# Patient Record
Sex: Female | Born: 1965
Health system: Southern US, Community
[De-identification: ages and names within clinical notes are randomized; demographics above are authoritative.]

## PROBLEM LIST (undated history)

## (undated) DIAGNOSIS — R7301 Impaired fasting glucose: Secondary | ICD-10-CM

## (undated) DIAGNOSIS — Z9889 Other specified postprocedural states: Secondary | ICD-10-CM

## (undated) DIAGNOSIS — F338 Other recurrent depressive disorders: Secondary | ICD-10-CM

## (undated) DIAGNOSIS — R112 Nausea with vomiting, unspecified: Secondary | ICD-10-CM

## (undated) DIAGNOSIS — J302 Other seasonal allergic rhinitis: Secondary | ICD-10-CM

## (undated) DIAGNOSIS — T7840XA Allergy, unspecified, initial encounter: Secondary | ICD-10-CM

## (undated) HISTORY — DX: Allergy, unspecified, initial encounter: T78.40XA

## (undated) HISTORY — PX: EYE SURGERY: SHX253

## (undated) HISTORY — PX: DILATION AND CURETTAGE OF UTERUS: SHX78

## (undated) HISTORY — DX: Other recurrent depressive disorders: F33.8

## (undated) HISTORY — PX: LIPOSUCTION: SHX10

## (undated) HISTORY — DX: Impaired fasting glucose: R73.01

---

## 2004-06-13 ENCOUNTER — Ambulatory Visit (HOSPITAL_COMMUNITY): Admission: RE | Admit: 2004-06-13 | Discharge: 2004-06-13 | Payer: Self-pay | Admitting: Family Medicine

## 2006-03-03 ENCOUNTER — Ambulatory Visit (HOSPITAL_COMMUNITY): Admission: RE | Admit: 2006-03-03 | Discharge: 2006-03-03 | Payer: Self-pay | Admitting: Family Medicine

## 2009-06-21 ENCOUNTER — Encounter: Admission: RE | Admit: 2009-06-21 | Discharge: 2009-06-21 | Payer: Self-pay | Admitting: Family Medicine

## 2009-07-03 ENCOUNTER — Encounter: Admission: RE | Admit: 2009-07-03 | Discharge: 2009-07-03 | Payer: Self-pay | Admitting: Family Medicine

## 2010-07-11 ENCOUNTER — Encounter: Admission: RE | Admit: 2010-07-11 | Discharge: 2010-07-11 | Payer: Self-pay | Admitting: Family Medicine

## 2010-08-31 ENCOUNTER — Encounter: Payer: Self-pay | Admitting: Family Medicine

## 2011-06-10 ENCOUNTER — Other Ambulatory Visit: Payer: Self-pay | Admitting: Family Medicine

## 2011-06-10 DIAGNOSIS — Z1231 Encounter for screening mammogram for malignant neoplasm of breast: Secondary | ICD-10-CM

## 2011-07-17 ENCOUNTER — Ambulatory Visit
Admission: RE | Admit: 2011-07-17 | Discharge: 2011-07-17 | Disposition: A | Discharge status: Home / Self Care | Payer: BC Managed Care – PPO | Source: Ambulatory Visit | Attending: Family Medicine | Admitting: Family Medicine

## 2011-07-17 DIAGNOSIS — Z1231 Encounter for screening mammogram for malignant neoplasm of breast: Secondary | ICD-10-CM

## 2012-05-24 ENCOUNTER — Other Ambulatory Visit: Payer: Self-pay | Admitting: Family Medicine

## 2012-05-24 DIAGNOSIS — M549 Dorsalgia, unspecified: Secondary | ICD-10-CM

## 2012-05-26 ENCOUNTER — Ambulatory Visit (HOSPITAL_COMMUNITY): Payer: Self-pay

## 2012-06-01 ENCOUNTER — Other Ambulatory Visit: Payer: Self-pay | Admitting: Family Medicine

## 2012-06-01 DIAGNOSIS — M549 Dorsalgia, unspecified: Secondary | ICD-10-CM

## 2012-06-07 ENCOUNTER — Ambulatory Visit
Admission: RE | Admit: 2012-06-07 | Discharge: 2012-06-07 | Disposition: A | Discharge status: Home / Self Care | Payer: Managed Care, Other (non HMO) | Source: Ambulatory Visit | Attending: Family Medicine | Admitting: Family Medicine

## 2012-06-07 ENCOUNTER — Other Ambulatory Visit: Payer: Self-pay | Admitting: Family Medicine

## 2012-06-07 DIAGNOSIS — M549 Dorsalgia, unspecified: Secondary | ICD-10-CM

## 2012-06-07 MED ORDER — METHYLPREDNISOLONE ACETATE 40 MG/ML INJ SUSP (RADIOLOG
120.0000 mg | Freq: Once | INTRAMUSCULAR | Status: AC
  Administered 2012-06-07: 120 mg via EPIDURAL

## 2012-06-07 MED ORDER — IOHEXOL 180 MG/ML  SOLN
1.0000 mL | Freq: Once | INTRAMUSCULAR | Status: AC | PRN
  Administered 2012-06-07: 1 mL via EPIDURAL

## 2012-07-05 ENCOUNTER — Other Ambulatory Visit: Payer: Self-pay | Admitting: Family Medicine

## 2012-07-05 DIAGNOSIS — M549 Dorsalgia, unspecified: Secondary | ICD-10-CM

## 2012-07-15 ENCOUNTER — Ambulatory Visit
Admission: RE | Admit: 2012-07-15 | Discharge: 2012-07-15 | Disposition: A | Discharge status: Home / Self Care | Payer: Managed Care, Other (non HMO) | Source: Ambulatory Visit | Attending: Family Medicine | Admitting: Family Medicine

## 2012-07-15 ENCOUNTER — Ambulatory Visit
Admission: RE | Admit: 2012-07-15 | Discharge: 2012-07-15 | Disposition: A | Discharge status: Home / Self Care | Payer: Self-pay | Source: Ambulatory Visit | Attending: Family Medicine | Admitting: Family Medicine

## 2012-07-15 ENCOUNTER — Other Ambulatory Visit: Payer: Self-pay | Admitting: Family Medicine

## 2012-07-15 VITALS — BP 112/65 | HR 80

## 2012-07-15 DIAGNOSIS — M549 Dorsalgia, unspecified: Secondary | ICD-10-CM

## 2012-07-15 MED ORDER — IOHEXOL 180 MG/ML  SOLN
1.0000 mL | Freq: Once | INTRAMUSCULAR | Status: AC | PRN
  Administered 2012-07-15: 1 mL via EPIDURAL

## 2012-07-15 MED ORDER — METHYLPREDNISOLONE ACETATE 40 MG/ML INJ SUSP (RADIOLOG
120.0000 mg | Freq: Once | INTRAMUSCULAR | Status: AC
  Administered 2012-07-15: 120 mg via EPIDURAL

## 2012-07-19 ENCOUNTER — Other Ambulatory Visit: Payer: Self-pay | Admitting: Family Medicine

## 2012-07-19 DIAGNOSIS — Z1231 Encounter for screening mammogram for malignant neoplasm of breast: Secondary | ICD-10-CM

## 2012-07-22 ENCOUNTER — Other Ambulatory Visit: Payer: Managed Care, Other (non HMO)

## 2012-08-02 ENCOUNTER — Ambulatory Visit
Admission: RE | Admit: 2012-08-02 | Discharge: 2012-08-02 | Disposition: A | Discharge status: Home / Self Care | Payer: Managed Care, Other (non HMO) | Source: Ambulatory Visit | Attending: Family Medicine | Admitting: Family Medicine

## 2012-08-02 DIAGNOSIS — Z1231 Encounter for screening mammogram for malignant neoplasm of breast: Secondary | ICD-10-CM

## 2012-11-16 ENCOUNTER — Ambulatory Visit (INDEPENDENT_AMBULATORY_CARE_PROVIDER_SITE_OTHER): Payer: Managed Care, Other (non HMO) | Admitting: Family Medicine

## 2012-11-16 ENCOUNTER — Encounter: Payer: Self-pay | Admitting: Family Medicine

## 2012-11-16 VITALS — BP 112/78 | Ht 64.0 in | Wt 190.6 lb

## 2012-11-16 DIAGNOSIS — S0003XA Contusion of scalp, initial encounter: Secondary | ICD-10-CM

## 2012-11-16 DIAGNOSIS — S0083XA Contusion of other part of head, initial encounter: Secondary | ICD-10-CM

## 2012-11-16 DIAGNOSIS — S0093XA Contusion of unspecified part of head, initial encounter: Secondary | ICD-10-CM

## 2012-11-16 MED ORDER — AMOXICILLIN-POT CLAVULANATE 875-125 MG PO TABS: 1.0000 | ORAL_TABLET | Freq: Two times a day (BID) | ORAL | Status: DC

## 2012-11-16 NOTE — Patient Instructions (Signed)
If area on zygomatic arch not improving over next two days then we would do an xray, call sooner if any issues

## 2012-11-16 NOTE — Progress Notes (Signed)
  Subjective:    Patient ID: Ashley Mcintosh, female    DOB: 12/02/1965, 47 y.o.   MRN: [id number]  Head Injury  The incident occurred 12 to 24 hours ago. The injury mechanism was a direct blow. There was no loss of consciousness. There was no blood loss. The quality of the pain is described as dull and throbbing. The pain is at a severity of 3/10. The pain is mild. The pain has been fluctuating since the injury. Associated symptoms include blurred vision and headaches. She has tried acetaminophen and ice for the symptoms. The treatment provided mild relief.   She denies double vision she states she just feels fuzzy in her vision she also describes sinus pressure and pain on the right side he states is similar to what she gets when she gets a sinus infection she denies any drainage coughing the ball  Had bounced came through the open window at the concession stand striking the right side of her head at the level of the zygomatic arch. It did not hit her temple. There was no loss of consciousness. No nausea or vomiting. No prior history of any of this.   Review of Systems  Eyes: Positive for blurred vision.  Neurological: Positive for headaches.       Objective:   Physical Examvital signs noted. Pupils responsive to light optic disc sharp EOMI eardrums normal no hemorrhage seen throat normal temple areas nontender zygomatic arch on the right side tender with some bruising and swelling no deformity fell in the bone just tenderness sinus nontender neck no masses lungs clear        Assessment & Plan:  Head contusion-bruising of the zygomatic arch-should gradually get better I doubt that this is a concussion I don't find any evidence of the internal head injury I don't recommend CT scan or MRI at this point I doubt there is any internal subdural hematoma or temporal artery hematoma. I told the patient if she's not doing better in the next couple days we may need to do plain x-rays of the zygomatic  arch. She was given a prescription for antibiotics if her sinus symptoms get worse. Warning signs were discussed with the patient

## 2012-12-12 ENCOUNTER — Telehealth: Payer: Self-pay | Admitting: Family Medicine

## 2012-12-12 MED ORDER — FLUCONAZOLE 150 MG PO TABS: ORAL_TABLET | ORAL | Status: DC

## 2012-12-12 NOTE — Telephone Encounter (Signed)
Ashley Mcintosh I sent over the Diflucan can you check the dose on Qysemia and see if it is right before I print ir out for patient.

## 2012-12-12 NOTE — Telephone Encounter (Signed)
qsymia 7.5/46 #30 one po qd called into [facility] [place]. Pt notified on voicemail

## 2012-12-12 NOTE — Telephone Encounter (Signed)
One mo worth of qysimia. No ov re this since last aug--will need to see carolyn before further. Diflucan 150 one po three days apart

## 2012-12-12 NOTE — Telephone Encounter (Signed)
7.5/46 is correct dose for Qsymia; I can increase dose at office visit if needed

## 2012-12-12 NOTE — Telephone Encounter (Signed)
Patient needs RX of Fluconazole for yeast infection and also [facility]ine to [facility] [place]

## 2012-12-16 ENCOUNTER — Other Ambulatory Visit: Payer: Self-pay | Admitting: Nurse Practitioner

## 2012-12-16 ENCOUNTER — Telehealth: Payer: Self-pay | Admitting: Family Medicine

## 2012-12-16 MED ORDER — PHENTERMINE HCL 37.5 MG PO TABS: 37.5000 mg | ORAL_TABLET | Freq: Every day | ORAL | Status: DC

## 2012-12-16 NOTE — Telephone Encounter (Signed)
Sorry.  That was the last Rx on chart.  Will call in Phentermine.

## 2012-12-16 NOTE — Telephone Encounter (Signed)
Patient called last week to have [facility] [place]-the message was taken for phentermine, but the message changed to Qysmia.. Patient says that Merril Abbe is not covered under her insurance and would like her phentermine called back in.

## 2012-12-16 NOTE — Telephone Encounter (Signed)
Rx printed and faxed to [facility] [place]. Patient notified

## 2012-12-26 ENCOUNTER — Telehealth: Payer: Self-pay | Admitting: Family Medicine

## 2012-12-26 NOTE — Telephone Encounter (Signed)
Pt states she has not ever seen a back specialist or physical medicine specialist for her back. She states the sciatic nerve is bothering her. She uses Aleve that she was told to use. Pt is willing to see a specialist.

## 2012-12-26 NOTE — Telephone Encounter (Signed)
Had 2 lumbar injections in November.  Having same problems again.  Can Doctor refer for another shot or does she need to start all over again?  Does she need another MRI?  Symptoms are on the same side as before.  Please call patient.

## 2012-12-26 NOTE — Telephone Encounter (Signed)
Would not feel comfortable ordering a third injection. Has she ever been evaluated by a back specialist or physical medicine specialist for her back problem. Patient had MRI in October of 2013. Unless there is a dramatic shift in her problem I would not recommend a repeat at this point. Please discuss with the patient find out what she would like done.

## 2012-12-27 ENCOUNTER — Other Ambulatory Visit: Payer: Self-pay | Admitting: Family Medicine

## 2012-12-27 DIAGNOSIS — M5126 Other intervertebral disc displacement, lumbar region: Secondary | ICD-10-CM

## 2012-12-27 NOTE — Telephone Encounter (Signed)
i put in the order for specialist consultation. Our staff will  Arrange and call her

## 2012-12-27 NOTE — Telephone Encounter (Signed)
Left message to pt of order consult arranged

## 2013-01-11 ENCOUNTER — Other Ambulatory Visit: Payer: Self-pay | Admitting: Neurosurgery

## 2013-01-11 DIAGNOSIS — M549 Dorsalgia, unspecified: Secondary | ICD-10-CM

## 2013-01-19 ENCOUNTER — Other Ambulatory Visit: Payer: Self-pay | Admitting: Neurosurgery

## 2013-01-19 ENCOUNTER — Ambulatory Visit
Admission: RE | Admit: 2013-01-19 | Discharge: 2013-01-19 | Disposition: A | Discharge status: Home / Self Care | Payer: Managed Care, Other (non HMO) | Source: Ambulatory Visit | Attending: Neurosurgery | Admitting: Neurosurgery

## 2013-01-19 DIAGNOSIS — M549 Dorsalgia, unspecified: Secondary | ICD-10-CM

## 2013-01-19 MED ORDER — METHYLPREDNISOLONE ACETATE 40 MG/ML INJ SUSP (RADIOLOG: 120.0000 mg | Freq: Once | INTRAMUSCULAR | Status: DC

## 2013-01-19 MED ORDER — IOHEXOL 180 MG/ML  SOLN: 1.0000 mL | Freq: Once | INTRAMUSCULAR | Status: AC | PRN

## 2013-03-28 ENCOUNTER — Other Ambulatory Visit: Payer: Self-pay | Admitting: Nurse Practitioner

## 2013-04-14 ENCOUNTER — Telehealth: Payer: Self-pay | Admitting: Nurse Practitioner

## 2013-04-14 ENCOUNTER — Ambulatory Visit (INDEPENDENT_AMBULATORY_CARE_PROVIDER_SITE_OTHER): Payer: Managed Care, Other (non HMO) | Admitting: Nurse Practitioner

## 2013-04-14 ENCOUNTER — Encounter: Payer: Self-pay | Admitting: Nurse Practitioner

## 2013-04-14 VITALS — BP 116/78 | Ht 64.0 in | Wt 173.6 lb

## 2013-04-14 DIAGNOSIS — Z124 Encounter for screening for malignant neoplasm of cervix: Secondary | ICD-10-CM

## 2013-04-14 DIAGNOSIS — Z01419 Encounter for gynecological examination (general) (routine) without abnormal findings: Secondary | ICD-10-CM

## 2013-04-14 DIAGNOSIS — R7301 Impaired fasting glucose: Secondary | ICD-10-CM

## 2013-04-14 DIAGNOSIS — Z0000 Encounter for general adult medical examination without abnormal findings: Secondary | ICD-10-CM

## 2013-04-14 LAB — BASIC METABOLIC PANEL
Calcium: 9.2 mg/dL (ref 8.4–10.5)
Glucose, Bld: 87 mg/dL (ref 70–99)
Potassium: 4.3 mEq/L (ref 3.5–5.3)
Sodium: 137 mEq/L (ref 135–145)

## 2013-04-14 LAB — LIPID PANEL
Cholesterol: 188 mg/dL (ref 0–200)
HDL: 71 mg/dL (ref >39)
Total CHOL/HDL Ratio: 2.6 Ratio
Triglycerides: 60 mg/dL (ref <150)
VLDL: 12 mg/dL (ref 0–40)

## 2013-04-14 LAB — HEMOGLOBIN A1C: Mean Plasma Glucose: 108 mg/dL (ref <117)

## 2013-04-14 MED ORDER — FLUCONAZOLE 150 MG PO TABS: ORAL_TABLET | ORAL | Status: DC

## 2013-04-14 MED ORDER — PHENTERMINE HCL 37.5 MG PO TABS: 37.5000 mg | ORAL_TABLET | Freq: Every day | ORAL | Status: DC

## 2013-04-14 NOTE — Telephone Encounter (Signed)
Patient is calling to tell you that she uses [facility]

## 2013-04-15 ENCOUNTER — Encounter: Payer: Self-pay | Admitting: Nurse Practitioner

## 2013-04-16 ENCOUNTER — Encounter: Payer: Self-pay | Admitting: Nurse Practitioner

## 2013-04-16 DIAGNOSIS — F338 Other recurrent depressive disorders: Secondary | ICD-10-CM | POA: Insufficient documentation

## 2013-04-16 DIAGNOSIS — R7301 Impaired fasting glucose: Secondary | ICD-10-CM | POA: Insufficient documentation

## 2013-04-16 MED ORDER — FLUTICASONE PROPIONATE 50 MCG/ACT NA SUSP: NASAL | Status: DC

## 2013-04-16 MED ORDER — BUPROPION HCL ER (XL) 150 MG PO TB24: 150.0000 mg | ORAL_TABLET | Freq: Every day | ORAL | Status: DC

## 2013-04-16 MED ORDER — NAPROXEN 500 MG PO TABS: 500.0000 mg | ORAL_TABLET | Freq: Two times a day (BID) | ORAL | Status: DC

## 2013-04-16 NOTE — Progress Notes (Signed)
Subjective:    Patient ID: Ashley Mcintosh, female    DOB: 10-Dec-1965, 47 y.o.   MRN: [id number]  HPI presents for a wellness checkup. Gets regular dental exams. Regular eye exams. Married, same sexual partner. No vaginal discharge or pelvic pain. Has Mirena for birth control. Staying active but limited due to persistent sciatica, had an injection in her back in June which helps some. Some difficulty sleeping. No caffeine intake. Would like to continue phentermine for 2 more months. Patient takes Wellbutrin during the wintertime for seasonal effective disorder. Requesting a refill on this. Plans to see a chiropractor for back pain. Takes occasional HCTZ no more than 2-3 times per week for edema.    Review of Systems  Constitutional: Positive for activity change and fatigue. Negative for fever and appetite change.  HENT: Positive for congestion and rhinorrhea. Negative for hearing loss, ear pain, sore throat, dental problem and sinus pressure.   Eyes: Negative for visual disturbance.  Respiratory: Negative for cough, chest tightness, shortness of breath and wheezing.   Cardiovascular: Negative for chest pain and leg swelling.  Gastrointestinal: Negative for nausea, vomiting, abdominal pain, diarrhea, constipation and blood in stool.  Genitourinary: Negative for dysuria, urgency, frequency, vaginal discharge, difficulty urinating, menstrual problem and pelvic pain.  Neurological: Negative for headaches.  Psychiatric/Behavioral: Positive for sleep disturbance. Negative for dysphoric mood. The patient is not nervous/anxious.        Objective:   Physical Exam  Vitals reviewed. Constitutional: She is oriented to person, place, and time. She appears well-developed. No distress.  HENT:  Right Ear: External ear normal.  Left Ear: External ear normal.  Mouth/Throat: Oropharynx is clear and moist.  Neck: Normal range of motion. Neck supple. No tracheal deviation present. No thyromegaly present.   Cardiovascular: Normal rate, regular rhythm and normal heart sounds.  Exam reveals no gallop.   No murmur heard. Pulmonary/Chest: Effort normal and breath sounds normal.  Abdominal: Soft. She exhibits no distension. There is no tenderness.  Genitourinary: Vagina normal and uterus normal. No vaginal discharge found.  Musculoskeletal: She exhibits no edema.  Lymphadenopathy:    She has no cervical adenopathy.  Neurological: She is alert and oriented to person, place, and time.  Skin: Skin is warm and dry. No rash noted.  Psychiatric: She has a normal mood and affect. Her behavior is normal.  Breast exam: No masses noted. Dense tissue. Axilla no adenopathy. External GU normal. Vagina no discharge. Cervix normal in appearance. No CMT. IUD string noted. Bimanual exam normal, no masses noted.       Assessment & Plan:  Well woman exam  Screening for cervical cancer - Plan: Pap IG w/ reflex to HPV when ASC-U  Routine general medical examination at a health care facility - Plan: Basic metabolic panel, Lipid panel  Impaired fasting glucose - Plan: Hemoglobin A1c  Meds ordered this encounter  Medications  . phentermine (ADIPEX-P) 37.5 MG tablet    Sig: Take 1 tablet (37.5 mg total) by mouth daily before breakfast.    Dispense:  30 tablet    Refill:  1    Order Specific Question:  Supervising Provider    Answer:  Merlyn Albert [[id number]]  . fluconazole (DIFLUCAN) 150 MG tablet    Sig: One tab 3 days apart    Dispense:  2 tablet    Refill:  0    Order Specific Question:  Supervising Provider    Answer:  Merlyn Albert [[id number]]  . buPROPion [facility]  XL) 150 MG 24 hr tablet    Sig: Take 1 tablet (150 mg total) by mouth daily.    Dispense:  90 tablet    Refill:  1    Order Specific Question:  Supervising Provider    Answer:  Merlyn Albert [[id number]]  . fluticasone (FLONASE) 50 MCG/ACT nasal spray    Sig: 2 sprays each nostril once daily prn allergies    Dispense:  48 g     Refill:  3    Order Specific Question:  Supervising Provider    Answer:  Merlyn Albert [[id number]]  . naproxen (NAPROSYN) 500 MG tablet    Sig: Take 1 tablet (500 mg total) by mouth 2 (two) times daily with a meal. Prn pain    Dispense:  90 tablet    Refill:  1    Order Specific Question:  Supervising Provider    Answer:  Merlyn Albert [[id number]]   Do not take Wellbutrin until phentermine is complete. Recommend melatonin 5-10 mg each bedtime for sleep. Call back if no improvement. Encouraged vitamin D and calcium in her diet. Next physical in one year.

## 2013-04-16 NOTE — Assessment & Plan Note (Signed)
FBS and hemoglobin A1c ordered.

## 2013-04-17 ENCOUNTER — Other Ambulatory Visit: Payer: Self-pay

## 2013-04-17 LAB — PAP IG W/ RFLX HPV ASCU

## 2013-04-17 MED ORDER — BUPROPION HCL ER (XL) 150 MG PO TB24: 150.0000 mg | ORAL_TABLET | Freq: Every day | ORAL | Status: DC

## 2013-04-17 MED ORDER — FLUTICASONE PROPIONATE 50 MCG/ACT NA SUSP: NASAL | Status: DC

## 2013-04-17 MED ORDER — NAPROXEN 500 MG PO TABS: 500.0000 mg | ORAL_TABLET | Freq: Two times a day (BID) | ORAL | Status: DC

## 2013-04-28 ENCOUNTER — Encounter: Payer: Self-pay | Admitting: Nurse Practitioner

## 2013-04-28 ENCOUNTER — Other Ambulatory Visit: Payer: Self-pay | Admitting: Nurse Practitioner

## 2013-04-28 MED ORDER — HYDROCHLOROTHIAZIDE 25 MG PO TABS: 25.0000 mg | ORAL_TABLET | Freq: Every day | ORAL | Status: DC

## 2013-05-11 ENCOUNTER — Other Ambulatory Visit: Payer: Self-pay | Admitting: Nurse Practitioner

## 2013-05-11 ENCOUNTER — Encounter: Payer: Self-pay | Admitting: Nurse Practitioner

## 2013-05-11 DIAGNOSIS — M549 Dorsalgia, unspecified: Secondary | ICD-10-CM

## 2013-06-01 ENCOUNTER — Encounter: Payer: Self-pay | Admitting: Nurse Practitioner

## 2013-06-02 ENCOUNTER — Other Ambulatory Visit: Payer: Self-pay | Admitting: Nurse Practitioner

## 2013-06-02 DIAGNOSIS — M549 Dorsalgia, unspecified: Secondary | ICD-10-CM

## 2013-06-16 ENCOUNTER — Ambulatory Visit
Admission: RE | Admit: 2013-06-16 | Discharge: 2013-06-16 | Disposition: A | Discharge status: Home / Self Care | Payer: Managed Care, Other (non HMO) | Source: Ambulatory Visit | Attending: Nurse Practitioner | Admitting: Nurse Practitioner

## 2013-06-16 ENCOUNTER — Other Ambulatory Visit: Payer: Self-pay | Admitting: Nurse Practitioner

## 2013-06-16 VITALS — BP 115/66 | HR 82

## 2013-06-16 DIAGNOSIS — M549 Dorsalgia, unspecified: Secondary | ICD-10-CM

## 2013-06-16 MED ORDER — METHYLPREDNISOLONE ACETATE 40 MG/ML INJ SUSP (RADIOLOG
120.0000 mg | Freq: Once | INTRAMUSCULAR | Status: AC
  Administered 2013-06-16: 120 mg via EPIDURAL

## 2013-06-16 MED ORDER — IOHEXOL 180 MG/ML  SOLN
1.0000 mL | Freq: Once | INTRAMUSCULAR | Status: AC | PRN
  Administered 2013-06-16: 1 mL via EPIDURAL

## 2013-07-12 ENCOUNTER — Encounter: Payer: Self-pay | Admitting: Nurse Practitioner

## 2013-07-12 ENCOUNTER — Ambulatory Visit (INDEPENDENT_AMBULATORY_CARE_PROVIDER_SITE_OTHER): Payer: Managed Care, Other (non HMO) | Admitting: Nurse Practitioner

## 2013-07-12 VITALS — BP 112/84 | Ht 64.0 in | Wt 182.2 lb

## 2013-07-12 DIAGNOSIS — M545 Low back pain, unspecified: Secondary | ICD-10-CM

## 2013-07-12 DIAGNOSIS — IMO0002 Reserved for concepts with insufficient information to code with codable children: Secondary | ICD-10-CM

## 2013-07-12 DIAGNOSIS — M792 Neuralgia and neuritis, unspecified: Secondary | ICD-10-CM

## 2013-07-12 MED ORDER — HYDROCODONE-ACETAMINOPHEN 5-325 MG PO TABS: 1.0000 | ORAL_TABLET | ORAL | Status: DC | PRN

## 2013-07-12 MED ORDER — PREDNISONE 20 MG PO TABS: ORAL_TABLET | ORAL | Status: DC

## 2013-07-12 NOTE — Progress Notes (Signed)
Subjective:  Presents for complaints of generalized pain along the lumbar area for the past 2 weeks. Patient gets regular injections in the left back area for sciatica. Her last ones were on 06/16/2013. This pain feels different. Patient has modified her work area where she can do sitting or standing. Cannot tolerate prolonged sitting or standing. Last night began having severe pain in the lateral upper thigh area localized. Pain was severe enough to keep her from sleeping. Has developed some weakness in the left leg especially with prolonged sitting or standing. No change in bowel or bladder habits. Has tried a TENS unit and anti-inflammatory with no relief. Has also tried visits to the chiropractor in the past with no relief.  Objective:   BP 112/84  Ht 5\' 4"  (1.626 m)  Wt 182 lb 4 oz (82.668 kg)  BMI 31.27 kg/m2 NAD. Alert, oriented. Lungs clear. Heart regular rate rhythm. Mild generalized lumbar paraspinal tenderness to palpation. No tenderness with palpation of the left hip joint. Good ROM of the left hip without any tenderness. SLR normal on the right, very limited on the left, produces severe pain in the low back area left side. Patellar and Achilles reflexes normal. Gait very slow but steady. Can perform heel and toe walking. Last MRI was October 2013.  Assessment:Low back pain - Plan: MR Lumbar Spine Wo Contrast  Neuropathic pain  Plan:  Meds ordered this encounter  Medications  . fluconazole (DIFLUCAN) 150 MG tablet    Sig: as needed. One tab 3 days apart  . predniSONE (DELTASONE) 20 MG tablet    Sig: 3 po qd x 3 d then 2 po qd x 3 d then 1 po qd x 3 d    Dispense:  18 tablet    Refill:  0    Order Specific Question:  Supervising Provider    Answer:  Merlyn Albert [[id number]]  . HYDROcodone-acetaminophen (NORCO/VICODIN) 5-325 MG per tablet    Sig: Take 1 tablet by mouth every 4 (four) hours as needed for moderate pain.    Dispense:  30 tablet    Refill:  0    Order Specific  Question:  Supervising Provider    Answer:  Riccardo Dubin   Ice/heat applications. Activities are work as tolerated. Further followup based on MRI report. Warning signs reviewed. Patient to call back sooner if any problems.

## 2013-07-12 NOTE — Assessment & Plan Note (Signed)
.   predniSONE (DELTASONE) 20 MG tablet    Sig: 3 po qd x 3 d then 2 po qd x 3 d then 1 po qd x 3 d    Dispense:  18 tablet    Refill:  0    Order Specific Question:  Supervising Provider    Answer:  Merlyn Albert [[id number]]  . HYDROcodone-acetaminophen (NORCO/VICODIN) 5-325 MG per tablet    Sig: Take 1 tablet by mouth every 4 (four) hours as needed for moderate pain.    Dispense:  30 tablet    Refill:  0    Order Specific Question:  Supervising Provider    Answer:  Riccardo Dubin   Ice/heat applications. Activities are work as tolerated. Further followup based on MRI report. Warning signs reviewed. Patient to call back sooner if any problems.

## 2013-07-12 NOTE — Assessment & Plan Note (Signed)
.   predniSONE (DELTASONE) 20 MG tablet    Sig: 3 po qd x 3 d then 2 po qd x 3 d then 1 po qd x 3 d    Dispense:  18 tablet    Refill:  0    Order Specific Question:  Supervising Provider    Answer:  LUKING, WILLIAM S [[id number]]  . HYDROcodone-acetaminophen (NORCO/VICODIN) 5-325 MG per tablet    Sig: Take 1 tablet by mouth every 4 (four) hours as needed for moderate pain.    Dispense:  30 tablet    Refill:  0    Order Specific Question:  Supervising Provider    Answer:  LUKING, WILLIAM S [[id number]]   Ice/heat applications. Activities are work as tolerated. Further followup based on MRI report. Warning signs reviewed. Patient to call back sooner if any problems. 

## 2013-07-13 ENCOUNTER — Other Ambulatory Visit: Payer: Self-pay | Admitting: *Deleted

## 2013-07-14 ENCOUNTER — Other Ambulatory Visit: Payer: Self-pay | Admitting: *Deleted

## 2013-07-14 ENCOUNTER — Encounter: Payer: Self-pay | Admitting: Nurse Practitioner

## 2013-07-14 MED ORDER — OXYCODONE-ACETAMINOPHEN 5-325 MG PO TABS: 1.0000 | ORAL_TABLET | Freq: Every evening | ORAL | Status: DC | PRN

## 2013-07-14 NOTE — Telephone Encounter (Signed)
Discussed with patient. Script ready for pickup.  

## 2013-07-15 ENCOUNTER — Encounter: Payer: Self-pay | Admitting: Nurse Practitioner

## 2013-07-15 ENCOUNTER — Emergency Department (HOSPITAL_COMMUNITY)
Admission: EM | Admit: 2013-07-15 | Discharge: 2013-07-15 | Disposition: A | Discharge status: Home / Self Care | Payer: Managed Care, Other (non HMO) | Attending: Emergency Medicine | Admitting: Emergency Medicine

## 2013-07-15 ENCOUNTER — Encounter (HOSPITAL_COMMUNITY): Payer: Self-pay | Admitting: Emergency Medicine

## 2013-07-15 DIAGNOSIS — M545 Low back pain, unspecified: Secondary | ICD-10-CM

## 2013-07-15 DIAGNOSIS — Z8659 Personal history of other mental and behavioral disorders: Secondary | ICD-10-CM | POA: Insufficient documentation

## 2013-07-15 DIAGNOSIS — Z79899 Other long term (current) drug therapy: Secondary | ICD-10-CM | POA: Insufficient documentation

## 2013-07-15 DIAGNOSIS — M5417 Radiculopathy, lumbosacral region: Secondary | ICD-10-CM

## 2013-07-15 DIAGNOSIS — IMO0002 Reserved for concepts with insufficient information to code with codable children: Secondary | ICD-10-CM | POA: Insufficient documentation

## 2013-07-15 DIAGNOSIS — Z880 Allergy status to penicillin: Secondary | ICD-10-CM | POA: Insufficient documentation

## 2013-07-15 MED ORDER — DEXAMETHASONE SODIUM PHOSPHATE 10 MG/ML IJ SOLN
INTRAMUSCULAR | Status: AC
  Administered 2013-07-15: 10 mg
  Filled 2013-07-15: qty 1

## 2013-07-15 MED ORDER — KETOROLAC TROMETHAMINE 60 MG/2ML IM SOLN
60.0000 mg | Freq: Once | INTRAMUSCULAR | Status: AC
  Administered 2013-07-15: 60 mg via INTRAMUSCULAR
  Filled 2013-07-15: qty 2

## 2013-07-15 MED ORDER — OXYCODONE-ACETAMINOPHEN 10-325 MG PO TABS: 1.0000 | ORAL_TABLET | ORAL | Status: DC | PRN

## 2013-07-15 MED ORDER — NAPROXEN 500 MG PO TABS: 500.0000 mg | ORAL_TABLET | Freq: Two times a day (BID) | ORAL | Status: DC

## 2013-07-15 NOTE — ED Provider Notes (Signed)
CSN: [id number]     Arrival date & time 07/15/13  1703 History   First MD Initiated Contact with Patient 07/15/13 1705     Chief Complaint  Patient presents with  . Back Pain   (Consider location/radiation/quality/duration/timing/severity/associated sxs/prior Treatment) Patient is a 47 y.o. female presenting with back pain.  Back Pain Associated symptoms: no chest pain, no dysuria and no fever    47 yo female with no recent hx of trauma presents with complaints of bilateral LBP with radiation to LEFT leg. Patient has had multiple steroid injections over the past year with some relief. Pain flared up at beginning of this week. Patient was seen on Wednesday of this week by PCP who scheduled an MRI and provided pain medications. Patient states pain worsened yesterday. Patient denies any relief with medication. Pain is sharp shooting and burning at times. Pain travel down LEFT posterior leg. Exacerbated by movement. Denies urinary incontinence and saddle anesthesia. Denies any other associated symptoms.  Past Medical History  Diagnosis Date  . Allergy   . Seasonal affective disorder   . Impaired fasting glucose    Past Surgical History  Procedure Laterality Date  . Liposuction     Family History  Problem Relation Age of Onset  . Hypertension Mother   . Cancer Brother     leukemia  . Cancer Paternal Aunt     breast  . Stroke Paternal Grandfather   . Diabetes Other    History  Substance Use Topics  . Smoking status: Never Smoker   . Smokeless tobacco: Never Used  . Alcohol Use: Not on file   OB History   Grav Para Term Preterm Abortions TAB SAB Ect Mult Living                 Review of Systems  Constitutional: Negative for fever and chills.  Respiratory: Negative for shortness of breath.   Cardiovascular: Negative for chest pain.  Genitourinary: Negative for dysuria, hematuria and flank pain.  Musculoskeletal: Positive for back pain. Negative for neck pain and neck  stiffness.    Allergies  Ciprofloxacin and Penicillins  Home Medications   Current Outpatient Rx  Name  Route  Sig  Dispense  Refill  . buPROPion (WELLBUTRIN XL) 150 MG 24 hr tablet   Oral   Take 1 tablet (150 mg total) by mouth daily.   90 tablet   1   . fluconazole (DIFLUCAN) 150 MG tablet      as needed. One tab 3 days apart         . fluticasone (FLONASE) 50 MCG/ACT nasal spray      2 sprays each nostril once daily prn allergies   48 g   3   . hydrochlorothiazide (HYDRODIURIL) 25 MG tablet   Oral   Take 1 tablet (25 mg total) by mouth daily.   90 tablet   1   . HYDROcodone-acetaminophen (NORCO/VICODIN) 5-325 MG per tablet   Oral   Take 1 tablet by mouth every 4 (four) hours as needed for moderate pain.   30 tablet   0   . levonorgestrel (MIRENA) 20 MCG/24HR IUD   Intrauterine   1 each by Intrauterine route once.         . naproxen (NAPROSYN) 500 MG tablet   Oral   Take 1 tablet (500 mg total) by mouth 2 (two) times daily with a meal. Prn pain   90 tablet   1   . naproxen (NAPROSYN) 500  MG tablet   Oral   Take 1 tablet (500 mg total) by mouth 2 (two) times daily.   20 tablet   0   . oxyCODONE-acetaminophen (PERCOCET) 10-325 MG per tablet   Oral   Take 1 tablet by mouth every 4 (four) hours as needed for pain.   15 tablet   0   . oxyCODONE-acetaminophen (PERCOCET/ROXICET) 5-325 MG per tablet   Oral   Take 1 tablet by mouth at bedtime as needed for severe pain.   30 tablet   0   . predniSONE (DELTASONE) 20 MG tablet      3 po qd x 3 d then 2 po qd x 3 d then 1 po qd x 3 d   18 tablet   0    BP 131/79  Pulse 86  Temp(Src) 98 F (36.7 C) (Oral)  Resp 19  SpO2 100% Physical Exam  Nursing note and vitals reviewed. Constitutional: She is oriented to person, place, and time. She appears well-developed and well-nourished. No distress.  HENT:  Head: Normocephalic and atraumatic.  Neck: Normal range of motion. Neck supple.   Cardiovascular: Normal rate and regular rhythm.  Exam reveals no gallop and no friction rub.   No murmur heard. Pulmonary/Chest: Effort normal and breath sounds normal. She has no wheezes. She has no rales.  Musculoskeletal: She exhibits no edema.  No midline tenderness. Right sided Lumbar paraspinal tightness.   SLR positive LEFT side. SLR Negative RIGHT side. Patient bears weight well. Has antalgic gait.   Neurological: She is alert and oriented to person, place, and time. She has normal strength.  Patellar eflexes intact and equal. Lower extremity strength equal bilaterally. No weakness appreciated on exam. Patient has good sensation bilaterally.    Skin: Skin is warm and dry. She is not diaphoretic.  Psychiatric: She has a normal mood and affect. Her behavior is normal.    ED Course  Procedures (including critical care time) Labs Review Labs Reviewed - No data to display Imaging Review No results found.  EKG Interpretation   None       MDM   1. Low back pain   2. Radiculopathy of lumbosacral region      No Red Flags present in hx or on exam. Percocet 10/325 given due to patient's current pain medication not controlling pain. Advised patient to avoid other Tylenold containing products. Educated patient on medication use. Advised. Followup with PCP in 2 days for review of MRI results and continued maintenance of LBP. Patient agrees with plan. Discussed patient with Dr. Eber Hong. Patient discharged in good condition.   Rudene Anda, PA-C 07/16/13 1106

## 2013-07-15 NOTE — ED Provider Notes (Signed)
47 year old female with a history of back pain over the last year who presents with a complaint of worsening back pain. The back pain is located in her bilateral lower back that radiates into her left buttock and down the left leg to her foot. This is similar in location in the left buttock and left leg however the bilateral lower back pain seems to be a new component of her symptoms. She has had multiple fluoroscopic guided injections at the S1 nerve root on the left with temporary improvement over the past year but over the past couple of weeks the symptoms have gradually gotten worse. She denies pathologic red flags for back pain, she has been seen by a neurosurgeon approximately one year ago but was not a surgical candidate at that time. She had an MRI performed on Wednesday but does not have the results and they are not available in the electronic medical record. She has recently taken both hydrocodone with Tylenol, oxycodone with Tylenol and Flexeril with minimal relief. On my exam the patient has tenderness with hyper dorsi flexion of the left foot, extension of the left leg and straight leg raise on the left side. The symptoms are somewhat relieved with flexion. She has slight decreased sensation in the S1 dermatome, there is normal reflexes, normal strength. She does appear to have a radiculopathy consistent with prior exam, her lower back pain at this time is unlikely to be pathologic but does need to have close followup with her family doctor to review her MRI, I have reviewed her plan of treatment with her, we'll at Toradol, Decadron and Naprosyn for the next week. Increase in the opiate portion of her pain medication and close followup if red flags should occur. Patient voices understanding of indications for return.  Medical screening examination/treatment/procedure(s) were conducted as a shared visit with non-physician practitioner(s) and myself.  I personally evaluated the patient during the  encounter.  Clinical Impression: S1 radiculopathy, Back pain      Vida Roller, MD 07/15/13 1742

## 2013-07-15 NOTE — ED Notes (Signed)
Pt c/o lower back pain that started last week but became worse yesterday, denies any known injury.

## 2013-07-17 ENCOUNTER — Telehealth: Payer: Self-pay | Admitting: *Deleted

## 2013-07-17 NOTE — Telephone Encounter (Signed)
Called patient to see if she would like to be seen today with Dr. Steve regarding ER visit for low back pain.  

## 2013-07-17 NOTE — Telephone Encounter (Signed)
Called patient to see if she would like to be seen today with Dr. Brett Canales regarding ER visit for low back pain.

## 2013-07-17 NOTE — Telephone Encounter (Signed)
Patient would like for Dr. Brett Canales to go over the results. She said she does not want to be seen, unless she needs to.

## 2013-07-17 NOTE — Telephone Encounter (Addendum)
Dr Brett Canales spoke with this patient and gave her the results of her report and started referral process to neurosurgeon.

## 2013-07-18 NOTE — ED Provider Notes (Signed)
47 year old female with a history of back pain over the last year who presents with a complaint of worsening back pain. The back pain is located in her bilateral lower back that radiates into her left buttock and down the left leg to her foot. This is similar in location in the left buttock and left leg however the bilateral lower back pain seems to be a new component of her symptoms. She has had multiple fluoroscopic guided injections at the S1 nerve root on the left with temporary improvement over the past year but over the past couple of weeks the symptoms have gradually gotten worse. She denies pathologic red flags for back pain, she has been seen by a neurosurgeon approximately one year ago but was not a surgical candidate at that time. She had an MRI performed on Wednesday but does not have the results and they are not available in the electronic medical record. She has recently taken both hydrocodone with Tylenol, oxycodone with Tylenol and Flexeril with minimal relief. On my exam the patient has tenderness with hyper dorsi flexion of the left foot, extension of the left leg and straight leg raise on the left side. The symptoms are somewhat relieved with flexion. She has slight decreased sensation in the S1 dermatome, there is normal reflexes, normal strength. She does appear to have a radiculopathy consistent with prior exam, her lower back pain at this time is unlikely to be pathologic but does need to have close followup with her family doctor to review her MRI, I have reviewed her plan of treatment with her, we'll at Toradol, Decadron and Naprosyn for the next week. Increase in the opiate portion of her pain medication and close followup if red flags should occur. Patient voices understanding of indications for return.  Ambulated well prior to d/c  Medical screening examination/treatment/procedure(s) were conducted as a shared visit with non-physician practitioner(s) and myself. I personally evaluated  the patient during the encounter.   Clinical Impression: S1 radiculopathy, Back pain   Vida Roller, MD 07/18/13 [phone]

## 2013-07-18 NOTE — Telephone Encounter (Signed)
FYI-referral faxed to [facility], they will call pt to set up, spoke with pt, she's aware that this has been done

## 2013-07-19 ENCOUNTER — Encounter: Payer: Self-pay | Admitting: Family Medicine

## 2013-07-19 ENCOUNTER — Telehealth: Payer: Self-pay | Admitting: Family Medicine

## 2013-07-19 ENCOUNTER — Ambulatory Visit (INDEPENDENT_AMBULATORY_CARE_PROVIDER_SITE_OTHER): Payer: Managed Care, Other (non HMO) | Admitting: Family Medicine

## 2013-07-19 VITALS — BP 118/80 | Ht 64.0 in | Wt 185.1 lb

## 2013-07-19 DIAGNOSIS — IMO0002 Reserved for concepts with insufficient information to code with codable children: Secondary | ICD-10-CM

## 2013-07-19 DIAGNOSIS — M792 Neuralgia and neuritis, unspecified: Secondary | ICD-10-CM

## 2013-07-19 NOTE — Telephone Encounter (Signed)
Pt forgot to ask what kind of OTC sinus medicine she can safely take with the Oxycodone she's on?  Please advise

## 2013-07-19 NOTE — Telephone Encounter (Signed)
Spoke with patient. Patient is scheduled to come in today with Dr. Brett Canales.

## 2013-07-19 NOTE — Progress Notes (Signed)
   Subjective:    Patient ID: Ashley Mcintosh, female    DOB: Sep 06, 1965, 47 y.o.   MRN: [id number]  HPI Patient is here today because she is experiencing numbness in her left leg and foot. It has been present for a while now. Patient was seen on Saturday at [facility] ER for severe back pain also.   Pain was very severe. Now patient is a deep ache in her calf. She notes she is limping. She is not sure whether this is due to pain or actual weakness.  Of note had several lumbar injections which in the past it helped calm down her symptoms did not help this time.  Saw the neurosurgeon earlier this summer he recommended she continue injections and not consider surgery at that time   Review of Systems    no change in urinary or bowel habits no chest pain no abdominal pain Objective:   Physical Exam  Alert mild to moderate distress. HEENT normal. Lungs clear. Heart regular in rhythm. Low back some left sciatic notch tenderness. Strongly positive left straight leg raise. Left ankle reflex absent. Left great toe very weak with extension. Lateral foot lateral calf diminished sensation pulses good.  MRI reviewed neurosurgery note reviewed ER notes reviewed    Assessment & Plan:  Impression progressive sciatica now with neuromuscular weakness. Just had a spinal injection just a month ago. We need to get her back urgently to the neurosurgeon. Discussed at great length. Easily 40 minutes spent in discussions with patient and assessing chart developing game plan in speaking with neurosurgeon plan as per orders. WSL

## 2013-07-19 NOTE — Telephone Encounter (Signed)
Sudafed tab

## 2013-07-20 ENCOUNTER — Ambulatory Visit: Payer: Managed Care, Other (non HMO) | Admitting: Nurse Practitioner

## 2013-07-20 NOTE — Telephone Encounter (Signed)
Patient notified

## 2013-07-25 ENCOUNTER — Other Ambulatory Visit: Payer: Self-pay | Admitting: Neurosurgery

## 2013-07-25 ENCOUNTER — Encounter (HOSPITAL_COMMUNITY): Payer: Self-pay | Admitting: Respiratory Therapy

## 2013-07-26 NOTE — Pre-Procedure Instructions (Signed)
Ashley Mcintosh  07/26/2013   Your procedure is scheduled on:  Friday August 04, 2013 @ 7:30 AM.  Report to [facility] Short Stay Entrance "A"  Admitting at 5:30 AM.  Call this number if you have problems the morning of surgery: [phone]   Remember:   Do not eat food or drink liquids after midnight.   Take these medicines the morning of surgery with A SIP OF WATER: Bupropion (Wellbutrin), Flonase nasal spray, Oxycodone (Percocet) if needed for pain  Discontinue aspirin and  herbal medications 7 days prior to surgery    Do not wear jewelry, make-up or nail polish.  Do not wear lotions, powders, or perfumes. You may wear deodorant.  Do not shave 48 hours prior to surgery. .  Do not bring valuables to the hospital.  [facility] is not responsible for any belongings or valuables.               Contacts, dentures or bridgework may not be worn into surgery.  Leave suitcase in the car. After surgery it may be brought to your room.  For patients admitted to the hospital, discharge time is determined by your treatment team.               Patients discharged the day of surgery will not be allowed to drive home.  Name and phone number of your driver: Family/Friend  Special Instructions: Shower using CHG 2 nights before surgery and the night before surgery.  If you shower the day of surgery use CHG.  Use special wash - you have one bottle of CHG for all showers.  You should use approximately 1/3 of the bottle for each shower.   Please read over the following fact sheets that you were given: Pain Booklet, Coughing and Deep Breathing, MRSA Information and Surgical Site Infection Prevention

## 2013-07-27 ENCOUNTER — Encounter (HOSPITAL_COMMUNITY): Payer: Self-pay

## 2013-07-27 ENCOUNTER — Other Ambulatory Visit: Payer: Self-pay

## 2013-07-27 ENCOUNTER — Encounter: Payer: Self-pay | Admitting: Family Medicine

## 2013-07-27 ENCOUNTER — Encounter (HOSPITAL_COMMUNITY)
Admission: RE | Admit: 2013-07-27 | Discharge: 2013-07-27 | Disposition: A | Discharge status: Home / Self Care | Payer: Managed Care, Other (non HMO) | Source: Ambulatory Visit | Attending: Neurosurgery | Admitting: Neurosurgery

## 2013-07-27 DIAGNOSIS — Z01812 Encounter for preprocedural laboratory examination: Secondary | ICD-10-CM | POA: Insufficient documentation

## 2013-07-27 HISTORY — DX: Nausea with vomiting, unspecified: R11.2

## 2013-07-27 HISTORY — DX: Other specified postprocedural states: Z98.890

## 2013-07-27 HISTORY — DX: Other seasonal allergic rhinitis: J30.2

## 2013-07-27 LAB — BASIC METABOLIC PANEL
CO2: 26 mEq/L (ref 19–32)
Calcium: 9 mg/dL (ref 8.4–10.5)
Chloride: 100 mEq/L (ref 96–112)
Glucose, Bld: 97 mg/dL (ref 70–99)
Sodium: 135 mEq/L (ref 135–145)

## 2013-07-27 LAB — CBC
HCT: 34.8 % — ABNORMAL LOW (ref 36.0–46.0)
Hemoglobin: 11.9 g/dL — ABNORMAL LOW (ref 12.0–15.0)
MCH: 30.1 pg (ref 26.0–34.0)
MCV: 88.1 fL (ref 78.0–100.0)
Platelets: 281 10*3/uL (ref 150–400)
RBC: 3.95 MIL/uL (ref 3.87–5.11)
RDW: 12.2 % (ref 11.5–15.5)
WBC: 9.4 10*3/uL (ref 4.0–10.5)

## 2013-07-27 LAB — SURGICAL PCR SCREEN: Staphylococcus aureus: NEGATIVE

## 2013-07-27 MED ORDER — OXYCODONE-ACETAMINOPHEN 10-325 MG PO TABS: 1.0000 | ORAL_TABLET | ORAL | Status: DC | PRN

## 2013-07-27 NOTE — Progress Notes (Signed)
Patient denied having any cardiac or pulmonary issues. When Nurse asked patient about hydrochlorothiazide patient stated "I don't take that for blood pressure. When it is time for my period to come on I have a lot of fluid retention so I take it when needed." Nurse called Pharmacy tech over to edit medication list. Patient voiced concerns about Anesthesiologist the DOS and was concerned about it being covered under her insurance. Revonda Standard, [place] contacted and Revonda Standard provided patient with the name of the Anesthesia group and number.

## 2013-07-28 ENCOUNTER — Ambulatory Visit (INDEPENDENT_AMBULATORY_CARE_PROVIDER_SITE_OTHER): Payer: Managed Care, Other (non HMO) | Admitting: Family Medicine

## 2013-07-28 ENCOUNTER — Telehealth: Payer: Self-pay | Admitting: Family Medicine

## 2013-07-28 ENCOUNTER — Encounter: Payer: Self-pay | Admitting: Family Medicine

## 2013-07-28 VITALS — BP 120/78 | Ht 64.0 in | Wt 185.0 lb

## 2013-07-28 DIAGNOSIS — J329 Chronic sinusitis, unspecified: Secondary | ICD-10-CM

## 2013-07-28 MED ORDER — CEFPROZIL 500 MG PO TABS: 500.0000 mg | ORAL_TABLET | Freq: Two times a day (BID) | ORAL | Status: DC

## 2013-07-28 MED ORDER — FLUCONAZOLE 150 MG PO TABS: 150.0000 mg | ORAL_TABLET | Freq: Once | ORAL | Status: DC

## 2013-07-28 NOTE — Addendum Note (Signed)
Addended by: Dereck Ligas on: 07/28/2013 12:10 PM   Modules accepted: Orders

## 2013-07-28 NOTE — Progress Notes (Signed)
   Subjective:    Patient ID: Ashley Mcintosh, female    DOB: 08/20/1965, 47 y.o.   MRN: [id number]  Sinus Problem This is a new problem. The current episode started in the past 7 days. Associated symptoms include congestion, coughing, headaches and sinus pressure.   Headache , chills and tuffiness,  Stuffy and frontal  Due to have surgery for ruptured disc this Friday. Still expressing pain weakness in left leg.  Review of Systems  HENT: Positive for congestion and sinus pressure.   Respiratory: Positive for cough.   Neurological: Positive for headaches.   No vomiting or diarrhea ROS otherwise negative    Objective:   Physical Exam  Alert moderate malaise. Frontal maxillary tenderness. Pharynx erythematous neck supple. Lungs clear. Heart regular rate and rhythm.      Assessment & Plan:  Impression 1 rhinosinusitis plan Cefzil 500 twice a day 10 days. Since Medicare discussed. Warning signs discussed. Also oxycodone as already prescribed for sciatica see telephone from yesterday WSL

## 2013-07-28 NOTE — Telephone Encounter (Signed)
Medication sent to pharmacy. Left message on voicemail notifying patient.  

## 2013-07-28 NOTE — Telephone Encounter (Signed)
Dif 150 one po three d apart

## 2013-07-28 NOTE — Telephone Encounter (Signed)
Patient was in this morning and forgot to ask for difucan for yeast infection while taking antibotic. Call into [facility].

## 2013-07-31 ENCOUNTER — Encounter: Payer: Self-pay | Admitting: Nurse Practitioner

## 2013-07-31 ENCOUNTER — Other Ambulatory Visit: Payer: Self-pay | Admitting: Nurse Practitioner

## 2013-07-31 MED ORDER — HYDROCHLOROTHIAZIDE 25 MG PO TABS: 25.0000 mg | ORAL_TABLET | Freq: Every day | ORAL | Status: DC | PRN

## 2013-08-03 MED ORDER — VANCOMYCIN HCL IN DEXTROSE 1-5 GM/200ML-% IV SOLN
1000.0000 mg | INTRAVENOUS | Status: AC
  Administered 2013-08-04: 1000 mg via INTRAVENOUS
  Filled 2013-08-03: qty 200

## 2013-08-04 ENCOUNTER — Encounter (HOSPITAL_COMMUNITY)
Admission: RE | Disposition: A | Discharge status: Home / Self Care | Payer: Self-pay | Source: Ambulatory Visit | Attending: Neurosurgery

## 2013-08-04 ENCOUNTER — Encounter (HOSPITAL_COMMUNITY): Payer: Managed Care, Other (non HMO) | Admitting: Anesthesiology

## 2013-08-04 ENCOUNTER — Encounter (HOSPITAL_COMMUNITY): Payer: Self-pay | Admitting: Anesthesiology

## 2013-08-04 ENCOUNTER — Ambulatory Visit (HOSPITAL_COMMUNITY): Payer: Managed Care, Other (non HMO) | Admitting: Anesthesiology

## 2013-08-04 ENCOUNTER — Ambulatory Visit (HOSPITAL_COMMUNITY)
Admission: RE | Admit: 2013-08-04 | Discharge: 2013-08-04 | Disposition: A | Discharge status: Home / Self Care | Payer: Managed Care, Other (non HMO) | Source: Ambulatory Visit | Attending: Neurosurgery | Admitting: Neurosurgery

## 2013-08-04 ENCOUNTER — Ambulatory Visit (HOSPITAL_COMMUNITY): Payer: Managed Care, Other (non HMO)

## 2013-08-04 DIAGNOSIS — F329 Major depressive disorder, single episode, unspecified: Secondary | ICD-10-CM | POA: Insufficient documentation

## 2013-08-04 DIAGNOSIS — F3289 Other specified depressive episodes: Secondary | ICD-10-CM | POA: Insufficient documentation

## 2013-08-04 DIAGNOSIS — M5126 Other intervertebral disc displacement, lumbar region: Secondary | ICD-10-CM | POA: Diagnosis present

## 2013-08-04 HISTORY — PX: LUMBAR LAMINECTOMY/DECOMPRESSION MICRODISCECTOMY: SHX5026

## 2013-08-04 SURGERY — LUMBAR LAMINECTOMY/DECOMPRESSION MICRODISCECTOMY 1 LEVEL
Anesthesia: General | Site: Back | Laterality: Left

## 2013-08-04 MED ORDER — LEVONORGESTREL 20 MCG/24HR IU IUD: 1.0000 | INTRAUTERINE_SYSTEM | Freq: Once | INTRAUTERINE | Status: DC

## 2013-08-04 MED ORDER — 0.9 % SODIUM CHLORIDE (POUR BTL) OPTIME
TOPICAL | Status: DC | PRN
  Administered 2013-08-04: 1000 mL

## 2013-08-04 MED ORDER — OXYCODONE HCL 5 MG/5ML PO SOLN: 5.0000 mg | Freq: Once | ORAL | Status: DC | PRN

## 2013-08-04 MED ORDER — SODIUM CHLORIDE 0.9 % IV SOLN
INTRAVENOUS | Status: DC | PRN
  Administered 2013-08-04: 08:00:00 via INTRAVENOUS

## 2013-08-04 MED ORDER — ONDANSETRON HCL 4 MG/2ML IJ SOLN: 4.0000 mg | INTRAMUSCULAR | Status: DC | PRN

## 2013-08-04 MED ORDER — SODIUM CHLORIDE 0.9 % IJ SOLN
3.0000 mL | Freq: Two times a day (BID) | INTRAMUSCULAR | Status: DC
  Administered 2013-08-04: 3 mL via INTRAVENOUS

## 2013-08-04 MED ORDER — GLYCOPYRROLATE 0.2 MG/ML IJ SOLN
INTRAMUSCULAR | Status: DC | PRN
  Administered 2013-08-04: 0.1 mg via INTRAVENOUS
  Administered 2013-08-04: 0.4 mg via INTRAVENOUS

## 2013-08-04 MED ORDER — KETOROLAC TROMETHAMINE 30 MG/ML IJ SOLN
30.0000 mg | Freq: Four times a day (QID) | INTRAMUSCULAR | Status: DC
  Administered 2013-08-04 (×2): 30 mg via INTRAVENOUS
  Filled 2013-08-04 (×4): qty 1

## 2013-08-04 MED ORDER — HYDROCHLOROTHIAZIDE 25 MG PO TABS
25.0000 mg | ORAL_TABLET | Freq: Every day | ORAL | Status: DC | PRN
  Filled 2013-08-04: qty 1

## 2013-08-04 MED ORDER — POTASSIUM CHLORIDE IN NACL 20-0.9 MEQ/L-% IV SOLN
INTRAVENOUS | Status: DC
  Filled 2013-08-04 (×2): qty 1000

## 2013-08-04 MED ORDER — BUPROPION HCL ER (XL) 300 MG PO TB24
450.0000 mg | ORAL_TABLET | Freq: Every day | ORAL | Status: DC
  Filled 2013-08-04: qty 1

## 2013-08-04 MED ORDER — FENTANYL CITRATE 0.05 MG/ML IJ SOLN: 25.0000 ug | INTRAMUSCULAR | Status: DC | PRN

## 2013-08-04 MED ORDER — FLUTICASONE PROPIONATE 50 MCG/ACT NA SUSP: 2.0000 | Freq: Every day | NASAL | Status: DC | PRN

## 2013-08-04 MED ORDER — PHENOL 1.4 % MT LIQD: 1.0000 | OROMUCOSAL | Status: DC | PRN

## 2013-08-04 MED ORDER — KETOROLAC TROMETHAMINE 30 MG/ML IJ SOLN
INTRAMUSCULAR | Status: AC
  Filled 2013-08-04: qty 1

## 2013-08-04 MED ORDER — SENNA 8.6 MG PO TABS: 1.0000 | ORAL_TABLET | Freq: Two times a day (BID) | ORAL | Status: DC

## 2013-08-04 MED ORDER — ONDANSETRON HCL 4 MG/2ML IJ SOLN: 4.0000 mg | Freq: Once | INTRAMUSCULAR | Status: DC | PRN

## 2013-08-04 MED ORDER — HYDROCODONE-ACETAMINOPHEN 5-325 MG PO TABS: 1.0000 | ORAL_TABLET | ORAL | Status: DC | PRN

## 2013-08-04 MED ORDER — SODIUM CHLORIDE 0.9 % IJ SOLN: 3.0000 mL | INTRAMUSCULAR | Status: DC | PRN

## 2013-08-04 MED ORDER — CEFUROXIME AXETIL 500 MG PO TABS
500.0000 mg | ORAL_TABLET | Freq: Two times a day (BID) | ORAL | Status: DC
  Filled 2013-08-04 (×2): qty 1

## 2013-08-04 MED ORDER — OXYCODONE HCL 5 MG PO TABS: 5.0000 mg | ORAL_TABLET | Freq: Once | ORAL | Status: DC | PRN

## 2013-08-04 MED ORDER — MENTHOL 3 MG MT LOZG: 1.0000 | LOZENGE | OROMUCOSAL | Status: DC | PRN

## 2013-08-04 MED ORDER — EPHEDRINE SULFATE 50 MG/ML IJ SOLN
INTRAMUSCULAR | Status: DC | PRN
  Administered 2013-08-04 (×2): 5 mg via INTRAVENOUS

## 2013-08-04 MED ORDER — SCOPOLAMINE 1 MG/3DAYS TD PT72
MEDICATED_PATCH | TRANSDERMAL | Status: AC
  Administered 2013-08-04: 1 via TRANSDERMAL
  Filled 2013-08-04: qty 1

## 2013-08-04 MED ORDER — ACETAMINOPHEN 650 MG RE SUPP: 650.0000 mg | RECTAL | Status: DC | PRN

## 2013-08-04 MED ORDER — FENTANYL CITRATE 0.05 MG/ML IJ SOLN
INTRAMUSCULAR | Status: DC | PRN
  Administered 2013-08-04: 100 ug via INTRAVENOUS
  Administered 2013-08-04 (×3): 50 ug via INTRAVENOUS

## 2013-08-04 MED ORDER — LIDOCAINE-EPINEPHRINE 0.5 %-1:200000 IJ SOLN
INTRAMUSCULAR | Status: DC | PRN
  Administered 2013-08-04: 10 mL

## 2013-08-04 MED ORDER — ROCURONIUM BROMIDE 100 MG/10ML IV SOLN
INTRAVENOUS | Status: DC | PRN
  Administered 2013-08-04: 40 mg via INTRAVENOUS

## 2013-08-04 MED ORDER — DEXAMETHASONE SODIUM PHOSPHATE 4 MG/ML IJ SOLN
INTRAMUSCULAR | Status: DC | PRN
  Administered 2013-08-04: 8 mg via INTRAVENOUS

## 2013-08-04 MED ORDER — ACETAMINOPHEN 325 MG PO TABS: 650.0000 mg | ORAL_TABLET | ORAL | Status: DC | PRN

## 2013-08-04 MED ORDER — MIDAZOLAM HCL 5 MG/5ML IJ SOLN
INTRAMUSCULAR | Status: DC | PRN
  Administered 2013-08-04: 1 mg via INTRAVENOUS

## 2013-08-04 MED ORDER — ONDANSETRON HCL 4 MG/2ML IJ SOLN
INTRAMUSCULAR | Status: DC | PRN
  Administered 2013-08-04 (×2): 4 mg via INTRAVENOUS

## 2013-08-04 MED ORDER — DIAZEPAM 5 MG PO TABS: 5.0000 mg | ORAL_TABLET | Freq: Four times a day (QID) | ORAL | Status: DC | PRN

## 2013-08-04 MED ORDER — LACTATED RINGERS IV SOLN
INTRAVENOUS | Status: DC | PRN
  Administered 2013-08-04 (×3): via INTRAVENOUS

## 2013-08-04 MED ORDER — HYDROMORPHONE HCL PF 1 MG/ML IJ SOLN
0.5000 mg | INTRAMUSCULAR | Status: DC | PRN
  Administered 2013-08-04: 1 mg via INTRAVENOUS
  Filled 2013-08-04: qty 1

## 2013-08-04 MED ORDER — ARTIFICIAL TEARS OP OINT
TOPICAL_OINTMENT | OPHTHALMIC | Status: DC | PRN
  Administered 2013-08-04: 1 via OPHTHALMIC

## 2013-08-04 MED ORDER — NEOSTIGMINE METHYLSULFATE 1 MG/ML IJ SOLN
INTRAMUSCULAR | Status: DC | PRN
  Administered 2013-08-04: 3 mg via INTRAVENOUS

## 2013-08-04 MED ORDER — LIDOCAINE HCL (CARDIAC) 20 MG/ML IV SOLN
INTRAVENOUS | Status: DC | PRN
  Administered 2013-08-04: 50 mg via INTRAVENOUS

## 2013-08-04 MED ORDER — PSEUDOEPHEDRINE HCL ER 120 MG PO TB12
120.0000 mg | ORAL_TABLET | Freq: Every day | ORAL | Status: DC | PRN
  Filled 2013-08-04: qty 1

## 2013-08-04 MED ORDER — THROMBIN 5000 UNITS EX SOLR
CUTANEOUS | Status: DC | PRN
  Administered 2013-08-04 (×2): 5000 [IU] via TOPICAL

## 2013-08-04 MED ORDER — OXYCODONE-ACETAMINOPHEN 5-325 MG PO TABS: 1.0000 | ORAL_TABLET | ORAL | Status: DC | PRN

## 2013-08-04 MED ORDER — PROPOFOL 10 MG/ML IV BOLUS
INTRAVENOUS | Status: DC | PRN
  Administered 2013-08-04: 170 mg via INTRAVENOUS

## 2013-08-04 MED ORDER — HEMOSTATIC AGENTS (NO CHARGE) OPTIME
TOPICAL | Status: DC | PRN
  Administered 2013-08-04: 1 via TOPICAL

## 2013-08-04 SURGICAL SUPPLY — 53 items
BAG DECANTER FOR FLEXI CONT (MISCELLANEOUS) ×2 IMPLANT
BENZOIN TINCTURE PRP APPL 2/3 (GAUZE/BANDAGES/DRESSINGS) IMPLANT
BLADE SURG ROTATE 9660 (MISCELLANEOUS) IMPLANT
BUR MATCHSTICK NEURO 3.0 LAGG (BURR) ×2 IMPLANT
CANISTER SUCT 3000ML (MISCELLANEOUS) ×2 IMPLANT
CONT SPEC 4OZ CLIKSEAL STRL BL (MISCELLANEOUS) ×2 IMPLANT
DECANTER SPIKE VIAL GLASS SM (MISCELLANEOUS) ×2 IMPLANT
DERMABOND ADHESIVE PROPEN (GAUZE/BANDAGES/DRESSINGS) ×1
DERMABOND ADVANCED (GAUZE/BANDAGES/DRESSINGS) ×1
DERMABOND ADVANCED .7 DNX12 (GAUZE/BANDAGES/DRESSINGS) ×1 IMPLANT
DERMABOND ADVANCED .7 DNX6 (GAUZE/BANDAGES/DRESSINGS) ×1 IMPLANT
DRAPE LAPAROTOMY 100X72X124 (DRAPES) ×2 IMPLANT
DRAPE MICROSCOPE LEICA (MISCELLANEOUS) ×2 IMPLANT
DRAPE POUCH INSTRU U-SHP 10X18 (DRAPES) ×2 IMPLANT
DRAPE SURG 17X23 STRL (DRAPES) ×2 IMPLANT
DURAPREP 26ML APPLICATOR (WOUND CARE) ×2 IMPLANT
ELECT REM PT RETURN 9FT ADLT (ELECTROSURGICAL) ×2
ELECTRODE REM PT RTRN 9FT ADLT (ELECTROSURGICAL) ×1 IMPLANT
GAUZE SPONGE 4X4 16PLY XRAY LF (GAUZE/BANDAGES/DRESSINGS) IMPLANT
GLOVE BIOGEL PI IND STRL 7.0 (GLOVE) ×3 IMPLANT
GLOVE BIOGEL PI IND STRL 8.5 (GLOVE) ×1 IMPLANT
GLOVE BIOGEL PI INDICATOR 7.0 (GLOVE) ×3
GLOVE BIOGEL PI INDICATOR 8.5 (GLOVE) ×1
GLOVE ECLIPSE 6.5 STRL STRAW (GLOVE) ×2 IMPLANT
GLOVE ECLIPSE 8.5 STRL (GLOVE) ×2 IMPLANT
GLOVE EXAM NITRILE LRG STRL (GLOVE) IMPLANT
GLOVE EXAM NITRILE MD LF STRL (GLOVE) IMPLANT
GLOVE EXAM NITRILE XL STR (GLOVE) IMPLANT
GLOVE EXAM NITRILE XS STR PU (GLOVE) IMPLANT
GLOVE OPTIFIT SS 6.5 STRL BRWN (GLOVE) ×6 IMPLANT
GOWN BRE IMP SLV AUR LG STRL (GOWN DISPOSABLE) ×6 IMPLANT
GOWN BRE IMP SLV AUR XL STRL (GOWN DISPOSABLE) IMPLANT
GOWN STRL REIN 2XL LVL4 (GOWN DISPOSABLE) ×2 IMPLANT
KIT BASIN OR (CUSTOM PROCEDURE TRAY) ×2 IMPLANT
KIT ROOM TURNOVER OR (KITS) ×2 IMPLANT
NEEDLE HYPO 25X1 1.5 SAFETY (NEEDLE) ×2 IMPLANT
NEEDLE SPNL 18GX3.5 QUINCKE PK (NEEDLE) IMPLANT
NS IRRIG 1000ML POUR BTL (IV SOLUTION) ×2 IMPLANT
PACK LAMINECTOMY NEURO (CUSTOM PROCEDURE TRAY) ×2 IMPLANT
PAD ARMBOARD 7.5X6 YLW CONV (MISCELLANEOUS) ×6 IMPLANT
RUBBERBAND STERILE (MISCELLANEOUS) ×4 IMPLANT
SPONGE GAUZE 4X4 12PLY (GAUZE/BANDAGES/DRESSINGS) IMPLANT
SPONGE LAP 4X18 X RAY DECT (DISPOSABLE) IMPLANT
SPONGE SURGIFOAM ABS GEL SZ50 (HEMOSTASIS) ×2 IMPLANT
STRIP CLOSURE SKIN 1/2X4 (GAUZE/BANDAGES/DRESSINGS) IMPLANT
SUT VIC AB 0 CT1 18XCR BRD8 (SUTURE) ×1 IMPLANT
SUT VIC AB 0 CT1 8-18 (SUTURE) ×1
SUT VIC AB 2-0 CT1 18 (SUTURE) ×2 IMPLANT
SUT VIC AB 3-0 SH 8-18 (SUTURE) ×4 IMPLANT
SYR 20ML ECCENTRIC (SYRINGE) ×2 IMPLANT
TOWEL OR 17X24 6PK STRL BLUE (TOWEL DISPOSABLE) ×2 IMPLANT
TOWEL OR 17X26 10 PK STRL BLUE (TOWEL DISPOSABLE) ×2 IMPLANT
WATER STERILE IRR 1000ML POUR (IV SOLUTION) ×2 IMPLANT

## 2013-08-04 NOTE — Preoperative (Signed)
Beta Blockers   Reason not to administer Beta Blockers:Not Applicable 

## 2013-08-04 NOTE — Anesthesia Postprocedure Evaluation (Signed)
  Anesthesia Post-op Note  Patient: Ashley Mcintosh  Procedure(s) Performed: Procedure(s) with comments: LUMBAR LAMINECTOMY/DECOMPRESSION MICRODISCECTOMY 1 LEVEL (Left) - Left L5S1 microdiskectomy  Patient Location: 3C09 Anesthesia Type:General  Level of Consciousness: awake, alert  and oriented  Airway and Oxygen Therapy: Patient Spontanous Breathing  Post-op Pain: mild  Post-op Assessment: Post-op Vital signs reviewed, Patient's Cardiovascular Status Stable, Respiratory Function Stable, Patent Airway, No signs of Nausea or vomiting, Adequate PO intake and Pain level controlled  Post-op Vital Signs: stable  Complications: No apparent anesthesia complications

## 2013-08-04 NOTE — Plan of Care (Signed)
Problem: Consults Goal: Diagnosis - Spinal Surgery Outcome: Completed/Met Date Met:  08/04/13 Lumbar Laminectomy (Complex)

## 2013-08-04 NOTE — Progress Notes (Signed)
Pt. Alert and oriented,follows simple instructions, denies pain. Incision area without swelling, redness or S/S of infection. Voiding adequate clear yellow urine. Moving all extremities well and vitals stable and documented. Patient discharged home with spouse. Script and Lumbar surgery notes instructions given to patient and family member for home safety and precautions. Pt. and family stated understanding of instructions given.

## 2013-08-04 NOTE — Anesthesia Preprocedure Evaluation (Addendum)
Anesthesia Evaluation  Patient identified by MRN, date of birth, ID band Patient awake    Reviewed: Allergy & Precautions, H&P , NPO status , Patient's Chart, lab work & pertinent test results  History of Anesthesia Complications (+) PONV and history of anesthetic complications  Airway Mallampati: II TM Distance: >3 FB Neck ROM: Full    Dental  (+) Teeth Intact and Dental Advisory Given   Pulmonary  Recent URI breath sounds clear to auscultation        Cardiovascular negative cardio ROS  Rhythm:Regular Rate:Normal     Neuro/Psych PSYCHIATRIC DISORDERS Depression Seasonal Affective Disordernegative neurological ROS     GI/Hepatic negative GI ROS, Neg liver ROS,   Endo/Other  negative endocrine ROS  Renal/GU negative Renal ROS     Musculoskeletal   Abdominal   Peds  Hematology negative hematology ROS (+)   Anesthesia Other Findings   Reproductive/Obstetrics negative OB ROS                          Anesthesia Physical Anesthesia Plan  ASA: II  Anesthesia Plan: General   Post-op Pain Management:    Induction: Intravenous  Airway Management Planned: Oral ETT  Additional Equipment:   Intra-op Plan:   Post-operative Plan: Extubation in OR  Informed Consent: I have reviewed the patients History and Physical, chart, labs and discussed the procedure including the risks, benefits and alternatives for the proposed anesthesia with the patient or authorized representative who has indicated his/her understanding and acceptance.   Dental advisory given  Plan Discussed with: CRNA and Anesthesiologist  Anesthesia Plan Comments: (HNP L5-S1 Post-op N/V  Plan GA with oral ETT  Kipp Brood, MD)        Anesthesia Quick Evaluation

## 2013-08-04 NOTE — Transfer of Care (Signed)
Immediate Anesthesia Transfer of Care Note  Patient: Ashley Mcintosh  Procedure(s) Performed: Procedure(s) with comments: LUMBAR LAMINECTOMY/DECOMPRESSION MICRODISCECTOMY 1 LEVEL (Left) - Left L5S1 microdiskectomy  Patient Location: PACU  Anesthesia Type:General  Level of Consciousness: awake, alert , oriented and patient cooperative  Airway & Oxygen Therapy: Patient Spontanous Breathing and Patient connected to nasal cannula oxygen  Post-op Assessment: Report given to PACU RN and Post -op Vital signs reviewed and stable  Post vital signs: Reviewed  Complications: No apparent anesthesia complications

## 2013-08-04 NOTE — H&P (Signed)
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  She reports a history since 2013 of severe pain in the back and left lower extremity.  An MRI done in the fall of last year shows a herniated disc at L5-S1 on the left side.  She has been treated successfully with epidural injections.  She was sent to me today because she started having pain once more and asked her physician's group for third injection.  Her doctor happened to not be there and she was told that she would need to see me.  She has pain from the left hip which runs down the left leg.  No bowel or bladder dysfunction.  She has some weakness in the left hip and then numb dull pain.  She has severe pain whenever she sits.  She can deal with almost everything whenever she can stand.    REVIEW OF SYSTEMS:                                    Positive for sinus headaches and her back and leg pain.  Denies Constitutional, Cardiovascular, Respiratory, Gastrointestinal, Genitourinary, Musculoskeletal, Skin, Neurological, Psychiatric, Endocrine, Hematologic or Allergic problems.   PAST MEDICAL HISTORY:                                Otherwise excellent.            Prior Operations:  She has undergone a dilation and curettage.             Medications and Allergies:  SHE HAS COMBATIVENESS WHEN SHE TAKES PENICILLIN AND SWELLING AND HIVES WHEN SHE TAKES CIPROFLOXACIN.  She takes phentermine and hydrochlorothiazide.   FAMILY HISTORY:                                            Mother is 74.  Father is 6.  Diabetes present in the family history.   SOCIAL HISTORY:                                            She does not smoke.  She does drink alcohol.  She does not use illicit drugs.   PHYSICAL EXAMINATION:                                She is 161 cm in height.  Weighs 176 pounds.  She has lost 12 pounds over the last two months by exercising.   NEUROLOGICAL  EXAMINATION:                       She is alert and oriented x4 and answering all questions appropriately.  Memory, language, attention span and fund of knowledge are normal.  Pupils are equal, round and reactive to light.  Full extraocular movements.  Full visual fields.  Hearing intact to finger rub bilaterally.  Symmetrical facial sensation and movement.  Uvula elevates in the midline.  Shoulder shrug is  normal.  Tongue protrudes in the midline.  Strength is 5/5 in both upper and lower extremities.  2+ reflexes knees and ankles bilaterally, biceps, triceps, and brachioradialis bilaterally.  Proprioception intact in both upper and lower extremities.  Positive straight leg raise on the left at approximately 35 degrees.  Negative crossed straight leg raising.  Gait is otherwise normal.  She can toe walk and heel walk.  Muscle tone, bulk and coordination are normal.   DIAGNOSTIC DATA:                                          MRI was reviewed and shows a herniated disc just central to the left side at L5-S1. The rest of the spine looks quite good and alignment overall was normal.  Clonus is normal.  Cauda equina is also normal.  Paraspinal soft tissues are normal.   [name] returns today stating that she is doing worse now than she was when I saw her in June.  She has herniated disc at L5-S1, it is eccentric to the left side.  I think the new film shows that this has gotten larger than it was.  I think this is the reason she is having these problems today that she was not having previously.  She did her best with time.  She has done the best with the injections and frankly at this point she just feels that she cannot continue to go on.  She used the TENS unit; however at any prolonged sitting or standing, has the severe pain now in the left lower extremity.  Again given the fact that I believe the disc herniation is slightly large now than it had been, I think at this point she probably would do well with  operative treatment.  She at this point would like to try an operation.  She has had anti-inflammatories.  She has had steroids.  None of these provided any significant relief.  So, we will proceed with that plan.  We will do this next week the day after Christmas.  Risks, benefits, bleeding, and infection, and no relief were explained.  She received a detailed instruction sheet.

## 2013-08-04 NOTE — Anesthesia Procedure Notes (Signed)
Procedure Name: Intubation Date/Time: 08/04/2013 8:15 AM Performed by: Romie Minus K Pre-anesthesia Checklist: Patient identified, Emergency Drugs available, Suction available, Patient being monitored and Timeout performed Patient Re-evaluated:Patient Re-evaluated prior to inductionOxygen Delivery Method: Circle system utilized Preoxygenation: Pre-oxygenation with 100% oxygen Intubation Type: IV induction Ventilation: Mask ventilation without difficulty and Oral airway inserted - appropriate to patient size Laryngoscope Size: Miller and 2 Grade View: Grade I Tube type: Oral Tube size: 7.5 mm Number of attempts: 1 Airway Equipment and Method: Stylet Placement Confirmation: ETT inserted through vocal cords under direct vision,  positive ETCO2,  CO2 detector and breath sounds checked- equal and bilateral Secured at: 21 cm Tube secured with: Tape Dental Injury: Teeth and Oropharynx as per pre-operative assessment

## 2013-08-04 NOTE — Op Note (Signed)
08/04/2013  9:57 AM  PATIENT:  Ashley Mcintosh  47 y.o. female  PRE-OPERATIVE DIAGNOSIS:  lumbar hernaited disc Left L5/S1  POST-OPERATIVE DIAGNOSIS:  lumbar hernaited disc Left L5/S1  PROCEDURE:  Procedure(s): LUMBAR LAMINECTOMY/DECOMPRESSION MICRODISCECTOMY 1 LEVEL Left L5/S1  SURGEON:  Surgeon(s): Carmela Hurt, MD Barnett Abu, MD  ASSISTANTS:Elsner, Sherilyn Cooter  ANESTHESIA:   general  EBL:  Total I/O In: 1950 [I.V.:1950] Out: 50 [Blood:50]  BLOOD ADMINISTERED:none  CELL SAVER GIVEN:none   COUNT:per nursing  DRAINS: none   SPECIMEN:  No Specimen  DICTATION: [name] was taken to the operating room, intubated and placed under a general anesthetic without difficulty. She was positioned prone on a Wilson frame with all pressure points padded. Her back was prepped and draped in a sterile manner. I opened the skin with a 10 blade and carried the dissection down to the thoracolumbar fascia. I used both sharp dissection and the monopolar cautery to expose the lamina of L5, and S1. I confirmed my location with an intraoperative xray.  I used the drill, Kerrison punches, and curettes to perform a semihemilaminectomy of L5. I used the punches to remove the ligamentum flavum to expose the thecal sac. I brought the microscope into the operative field and with Dr.Elsner's assistance we started our decompression of the spinal canal, thecal sac and S1 root(s). I cauterized epidural veins overlying the disc space then divided them sharply. I opened the disc space with a 15 blade and proceeded with the discectomy. I used pituitary rongeurs, curettes, and other instruments to remove disc material. After the discectomy was completed we inspected the S1 nerve root and felt it was well decompressed. I explored rostrally, laterally, medially, and caudally and was satisfied with the decompression. I irrigated the wound, then closed in layers. I approximated the thoracolumbar fascia, subcutaneous,  and subcuticular planes with vicryl sutures. I used dermabond for a sterile dressing.   PLAN OF CARE: Admit for overnight observation  PATIENT DISPOSITION:  PACU - hemodynamically stable.   Delay start of Pharmacological VTE agent (>24hrs) due to surgical blood loss or risk of bleeding:  yes

## 2013-08-07 ENCOUNTER — Encounter (HOSPITAL_COMMUNITY): Payer: Self-pay | Admitting: Neurosurgery

## 2013-08-13 ENCOUNTER — Encounter: Payer: Self-pay | Admitting: Nurse Practitioner

## 2013-08-18 ENCOUNTER — Encounter: Payer: Self-pay | Admitting: Family Medicine

## 2013-11-10 ENCOUNTER — Other Ambulatory Visit: Payer: Self-pay | Admitting: Nurse Practitioner

## 2013-11-10 MED ORDER — PHENTERMINE HCL 37.5 MG PO TABS: 37.5000 mg | ORAL_TABLET | Freq: Every day | ORAL | Status: DC

## 2013-11-15 ENCOUNTER — Other Ambulatory Visit: Payer: Self-pay

## 2013-11-15 DIAGNOSIS — Z1231 Encounter for screening mammogram for malignant neoplasm of breast: Secondary | ICD-10-CM

## 2013-11-30 ENCOUNTER — Ambulatory Visit
Admission: RE | Admit: 2013-11-30 | Discharge: 2013-11-30 | Disposition: A | Discharge status: Home / Self Care | Payer: Managed Care, Other (non HMO) | Source: Ambulatory Visit

## 2013-11-30 DIAGNOSIS — Z1231 Encounter for screening mammogram for malignant neoplasm of breast: Secondary | ICD-10-CM

## 2014-03-28 ENCOUNTER — Encounter: Payer: Self-pay | Admitting: Nurse Practitioner

## 2014-03-28 ENCOUNTER — Other Ambulatory Visit: Payer: Self-pay | Admitting: Nurse Practitioner

## 2014-03-28 ENCOUNTER — Other Ambulatory Visit: Payer: Self-pay | Admitting: *Deleted

## 2014-03-28 MED ORDER — PHENTERMINE HCL 37.5 MG PO TABS: 37.5000 mg | ORAL_TABLET | Freq: Every day | ORAL | Status: DC

## 2014-04-04 ENCOUNTER — Encounter: Payer: Self-pay | Admitting: Nurse Practitioner

## 2014-04-06 ENCOUNTER — Ambulatory Visit: Payer: Managed Care, Other (non HMO) | Admitting: Family Medicine

## 2014-04-20 ENCOUNTER — Encounter: Payer: Managed Care, Other (non HMO) | Admitting: Nurse Practitioner

## 2014-04-27 ENCOUNTER — Encounter: Payer: Self-pay | Admitting: Nurse Practitioner

## 2014-04-27 ENCOUNTER — Ambulatory Visit (INDEPENDENT_AMBULATORY_CARE_PROVIDER_SITE_OTHER): Payer: Managed Care, Other (non HMO) | Admitting: Nurse Practitioner

## 2014-04-27 VITALS — BP 112/74 | Ht 64.0 in | Wt 170.0 lb

## 2014-04-27 DIAGNOSIS — Z0189 Encounter for other specified special examinations: Secondary | ICD-10-CM

## 2014-04-27 DIAGNOSIS — B3731 Acute candidiasis of vulva and vagina: Secondary | ICD-10-CM

## 2014-04-27 DIAGNOSIS — Z124 Encounter for screening for malignant neoplasm of cervix: Secondary | ICD-10-CM

## 2014-04-27 DIAGNOSIS — Z01419 Encounter for gynecological examination (general) (routine) without abnormal findings: Secondary | ICD-10-CM

## 2014-04-27 DIAGNOSIS — Z0000 Encounter for general adult medical examination without abnormal findings: Secondary | ICD-10-CM

## 2014-04-27 DIAGNOSIS — Z79899 Other long term (current) drug therapy: Secondary | ICD-10-CM

## 2014-04-27 DIAGNOSIS — M792 Neuralgia and neuritis, unspecified: Secondary | ICD-10-CM

## 2014-04-27 DIAGNOSIS — IMO0002 Reserved for concepts with insufficient information to code with codable children: Secondary | ICD-10-CM

## 2014-04-27 DIAGNOSIS — B373 Candidiasis of vulva and vagina: Secondary | ICD-10-CM

## 2014-04-27 DIAGNOSIS — R7301 Impaired fasting glucose: Secondary | ICD-10-CM

## 2014-04-27 LAB — LIPID PANEL
Cholesterol: 203 mg/dL — ABNORMAL HIGH (ref 0–200)
HDL: 66 mg/dL (ref >39)
LDL CALC: 117 mg/dL — AB (ref 0–99)
TRIGLYCERIDES: 100 mg/dL (ref <150)
Total CHOL/HDL Ratio: 3.1 Ratio
VLDL: 20 mg/dL (ref 0–40)

## 2014-04-27 LAB — BASIC METABOLIC PANEL
BUN: 14 mg/dL (ref 6–23)
CALCIUM: 9.5 mg/dL (ref 8.4–10.5)
CHLORIDE: 99 meq/L (ref 96–112)
CO2: 24 mEq/L (ref 19–32)
Creat: 0.84 mg/dL (ref 0.50–1.10)
Glucose, Bld: 98 mg/dL (ref 70–99)
Potassium: 4 mEq/L (ref 3.5–5.3)
Sodium: 136 mEq/L (ref 135–145)

## 2014-04-27 LAB — HEMOGLOBIN A1C
Hgb A1c MFr Bld: 5.4 % (ref <5.7)
MEAN PLASMA GLUCOSE: 108 mg/dL (ref <117)

## 2014-04-27 LAB — HEPATIC FUNCTION PANEL
ALBUMIN: 4.4 g/dL (ref 3.5–5.2)
ALT: 13 U/L (ref 0–35)
AST: 15 U/L (ref 0–37)
Alkaline Phosphatase: 84 U/L (ref 39–117)
BILIRUBIN INDIRECT: 0.7 mg/dL (ref 0.2–1.2)
Bilirubin, Direct: 0.1 mg/dL (ref 0.0–0.3)
TOTAL PROTEIN: 7.1 g/dL (ref 6.0–8.3)
Total Bilirubin: 0.8 mg/dL (ref 0.2–1.2)

## 2014-04-27 MED ORDER — FLUCONAZOLE 150 MG PO TABS: 150.0000 mg | ORAL_TABLET | Freq: Once | ORAL | Status: DC

## 2014-04-27 MED ORDER — GABAPENTIN 300 MG PO CAPS: ORAL_CAPSULE | ORAL | Status: DC

## 2014-04-27 MED ORDER — BUPROPION HCL ER (XL) 150 MG PO TB24: 150.0000 mg | ORAL_TABLET | Freq: Every day | ORAL | Status: DC

## 2014-04-29 ENCOUNTER — Encounter: Payer: Self-pay | Admitting: Nurse Practitioner

## 2014-04-30 ENCOUNTER — Other Ambulatory Visit: Payer: Self-pay | Admitting: Nurse Practitioner

## 2014-04-30 MED ORDER — HYDROCODONE-ACETAMINOPHEN 5-325 MG PO TABS: 1.0000 | ORAL_TABLET | ORAL | Status: DC | PRN

## 2014-05-01 ENCOUNTER — Encounter: Payer: Self-pay | Admitting: Nurse Practitioner

## 2014-05-01 ENCOUNTER — Other Ambulatory Visit: Payer: Self-pay | Admitting: Neurosurgery

## 2014-05-01 DIAGNOSIS — M5416 Radiculopathy, lumbar region: Secondary | ICD-10-CM

## 2014-05-01 LAB — NICOTINE/COTININE METABOLITES

## 2014-05-01 LAB — PAP IG W/ RFLX HPV ASCU

## 2014-05-01 NOTE — Progress Notes (Signed)
Subjective:    Patient ID: Ashley Mcintosh, female    DOB: 07-27-1966, 48 y.o.   MRN: [id number]  HPI presents for her wellness exam. Married, same sexual partner. Has Mirena, no menstrual cycle. Had her mammogram in April. Regular vision and dental exams. Needs routine lab work for her employer. Has chronic back pain. Is waiting to hear back from her specialist about repeat back injections. The case steroid taper 2 weeks ago which helps slightly. Slight white vaginal discharge, no itching or burning. No odor.    Review of Systems  Constitutional: Negative for fever, activity change, appetite change and fatigue.  HENT: Negative for dental problem, ear pain, sinus pressure and sore throat.   Respiratory: Negative for cough, chest tightness, shortness of breath and wheezing.   Cardiovascular: Negative for chest pain and leg swelling.  Gastrointestinal: Negative for nausea, vomiting, abdominal pain, diarrhea, constipation and abdominal distention.  Genitourinary: Positive for vaginal discharge. Negative for dysuria, urgency, frequency, vaginal bleeding, enuresis, difficulty urinating, genital sores, menstrual problem and pelvic pain.  Musculoskeletal: Positive for back pain.       Objective:   Physical Exam  Vitals reviewed. Constitutional: She is oriented to person, place, and time. She appears well-developed. No distress.  HENT:  Right Ear: External ear normal.  Left Ear: External ear normal.  Mouth/Throat: Oropharynx is clear and moist.  Neck: Normal range of motion. Neck supple. No tracheal deviation present. No thyromegaly present.  Cardiovascular: Normal rate, regular rhythm and normal heart sounds.  Exam reveals no gallop.   No murmur heard. Pulmonary/Chest: Effort normal and breath sounds normal.  Abdominal: Soft. She exhibits no distension. There is no tenderness.  Genitourinary: Vagina normal and uterus normal. No vaginal discharge found.  External GU normal. A very small  amount of white clumpy discharge noted. No CMT. IUD string noted. Bimanual exam no tenderness or obvious masses.  Musculoskeletal: She exhibits no edema.  Lymphadenopathy:    She has no cervical adenopathy.  Neurological: She is alert and oriented to person, place, and time.  Skin: Skin is warm and dry. No rash noted.  Psychiatric: She has a normal mood and affect. Her behavior is normal.   breast exam: Dense tissue, no obvious masses. Axilla no adenopathy. Wet prep: PH 4.5, occasional yeast.       Assessment & Plan:   Problem List Items Addressed This Visit     Endocrine   Impaired fasting glucose   Relevant Orders      Hemoglobin A1c (Completed)     Other   Neuropathic pain   Relevant Orders      Hepatic function panel (Completed)      Basic metabolic panel (Completed)    Other Visit Diagnoses   Well woman exam    -  Primary    Screening for cervical cancer        Relevant Orders       Pap IG w/ reflex to HPV when ASC-U (Completed)    Encounter for long-term (current) use of other medications        Routine general medical examination at a health care facility        Relevant Orders       Lipid panel (Completed)       Nicotine/cotinine metabolites (Completed)    Other specified examination        Relevant Orders       Nicotine/cotinine metabolites (Completed)    Vaginal candidiasis  Relevant Medications       fluconazole (DIFLUCAN) tablet 150 mg      Meds ordered this encounter  Medications  . buPROPion (WELLBUTRIN XL) 150 MG 24 hr tablet    Sig: Take 1 tablet (150 mg total) by mouth daily.    Dispense:  90 tablet    Refill:  2    Order Specific Question:  Supervising Provider    Answer:  Merlyn Albert [[id number]]  . fluconazole (DIFLUCAN) 150 MG tablet    Sig: Take 1 tablet (150 mg total) by mouth once. 3 days apart    Dispense:  2 tablet    Refill:  0    Order Specific Question:  Supervising Provider    Answer:  Merlyn Albert [[id number]]  .  gabapentin (NEURONTIN) 300 MG capsule    Sig: One po qhs x 3 d then one po BID for nerve pain    Dispense:  60 capsule    Refill:  2    Order Specific Question:  Supervising Provider    Answer:  Merlyn Albert [[id number]]   Encouraged daily vitamin D and calcium supplementation, healthy diet and regular activity. Discussed options regarding chronic back pain. Start Neurontin as directed, DC med and call if any problems. Continue followup with her specialist. Return in about 3 months (around 07/27/2014).

## 2014-05-08 ENCOUNTER — Other Ambulatory Visit: Payer: Self-pay | Admitting: Neurosurgery

## 2014-05-08 ENCOUNTER — Ambulatory Visit
Admission: RE | Admit: 2014-05-08 | Discharge: 2014-05-08 | Disposition: A | Discharge status: Home / Self Care | Payer: Managed Care, Other (non HMO) | Source: Ambulatory Visit | Attending: Neurosurgery | Admitting: Neurosurgery

## 2014-05-08 VITALS — BP 140/75 | HR 82

## 2014-05-08 DIAGNOSIS — M5416 Radiculopathy, lumbar region: Secondary | ICD-10-CM

## 2014-05-08 DIAGNOSIS — M5126 Other intervertebral disc displacement, lumbar region: Secondary | ICD-10-CM

## 2014-05-08 MED ORDER — IOHEXOL 180 MG/ML  SOLN
1.0000 mL | Freq: Once | INTRAMUSCULAR | Status: AC | PRN
  Administered 2014-05-08: 1 mL via EPIDURAL

## 2014-05-08 MED ORDER — METHYLPREDNISOLONE ACETATE 40 MG/ML INJ SUSP (RADIOLOG
120.0000 mg | Freq: Once | INTRAMUSCULAR | Status: AC
  Administered 2014-05-08: 120 mg via EPIDURAL

## 2014-05-08 NOTE — Discharge Instructions (Signed)

## 2014-05-16 ENCOUNTER — Other Ambulatory Visit: Payer: Self-pay | Admitting: Nurse Practitioner

## 2014-05-16 MED ORDER — HYDROCODONE-ACETAMINOPHEN 10-325 MG PO TABS: 1.0000 | ORAL_TABLET | Freq: Three times a day (TID) | ORAL | Status: DC | PRN

## 2014-05-24 ENCOUNTER — Other Ambulatory Visit: Payer: Self-pay | Admitting: Neurosurgery

## 2014-05-24 DIAGNOSIS — M5416 Radiculopathy, lumbar region: Secondary | ICD-10-CM

## 2014-05-31 ENCOUNTER — Other Ambulatory Visit: Payer: Self-pay | Admitting: Neurosurgery

## 2014-05-31 ENCOUNTER — Ambulatory Visit
Admission: RE | Admit: 2014-05-31 | Discharge: 2014-05-31 | Disposition: A | Discharge status: Home / Self Care | Payer: Managed Care, Other (non HMO) | Source: Ambulatory Visit | Attending: Neurosurgery | Admitting: Neurosurgery

## 2014-05-31 DIAGNOSIS — M5416 Radiculopathy, lumbar region: Secondary | ICD-10-CM

## 2014-05-31 MED ORDER — IOHEXOL 180 MG/ML  SOLN: 1.0000 mL | Freq: Once | INTRAMUSCULAR | Status: AC | PRN

## 2014-05-31 MED ORDER — METHYLPREDNISOLONE ACETATE 40 MG/ML INJ SUSP (RADIOLOG: 120.0000 mg | Freq: Once | INTRAMUSCULAR | Status: DC

## 2014-05-31 NOTE — Progress Notes (Signed)
Cold pack to injection site per pt request.

## 2014-06-14 ENCOUNTER — Other Ambulatory Visit: Payer: Self-pay | Admitting: Nurse Practitioner

## 2014-06-14 ENCOUNTER — Encounter: Payer: Self-pay | Admitting: Nurse Practitioner

## 2014-06-14 MED ORDER — FLUTICASONE PROPIONATE 50 MCG/ACT NA SUSP: 2.0000 | Freq: Every day | NASAL | Status: DC

## 2014-06-19 ENCOUNTER — Other Ambulatory Visit: Payer: Self-pay | Admitting: Neurosurgery

## 2014-06-19 DIAGNOSIS — M5416 Radiculopathy, lumbar region: Secondary | ICD-10-CM

## 2014-06-26 ENCOUNTER — Other Ambulatory Visit: Payer: Self-pay | Admitting: Nurse Practitioner

## 2014-06-26 MED ORDER — GABAPENTIN 300 MG PO CAPS: ORAL_CAPSULE | ORAL | Status: DC

## 2014-06-28 ENCOUNTER — Ambulatory Visit
Admission: RE | Admit: 2014-06-28 | Discharge: 2014-06-28 | Disposition: A | Discharge status: Home / Self Care | Payer: Managed Care, Other (non HMO) | Source: Ambulatory Visit | Attending: Neurosurgery | Admitting: Neurosurgery

## 2014-06-28 DIAGNOSIS — M5416 Radiculopathy, lumbar region: Secondary | ICD-10-CM

## 2014-06-28 MED ORDER — IOHEXOL 180 MG/ML  SOLN
15.0000 mL | Freq: Once | INTRAMUSCULAR | Status: AC | PRN
  Administered 2014-06-28: 15 mL via INTRATHECAL

## 2014-06-28 NOTE — Discharge Instructions (Signed)
Myelogram Discharge Instructions  1. Go home and rest quietly for the next 24 hours.  It is important to lie flat for the next 24 hours.  Get up only to go to the restroom.  You may lie in the bed or on a couch on your back, your stomach, your left side or your right side.  You may have one pillow under your head.  You may have pillows between your knees while you are on your side or under your knees while you are on your back.  2. DO NOT drive today.  Recline the seat as far back as it will go, while still wearing your seat belt, on the way home.  3. You may get up to go to the bathroom as needed.  You may sit up for 10 minutes to eat.  You may resume your normal diet and medications unless otherwise indicated.  Drink lots of extra fluids today and tomorrow.  4. The incidence of headache, nausea, or vomiting is about 5% (one in 20 patients).  If you develop a headache, lie flat and drink plenty of fluids until the headache goes away.  Caffeinated beverages may be helpful.  If you develop severe nausea and vomiting or a headache that does not go away with flat bed rest, call [phone].  5. You may resume normal activities after your 24 hours of bed rest is over; however, do not exert yourself strongly or do any heavy lifting tomorrow. If when you get up you have a headache when standing, go back to bed and force fluids for another 24 hours.  6. Call your physician for a follow-up appointment.  The results of your myelogram will be sent directly to your physician by the following day.  7. If you have any questions or if complications develop after you arrive home, please call [phone].  Discharge instructions have been explained to the patient.  The patient, or the person responsible for the patient, fully understands these instructions.      May resume Bupropion and Tramadol on Nov. 20, 2015, after 9:30 am.

## 2014-06-28 NOTE — Progress Notes (Signed)
Pt states she has been off Bupropion and Tramadol for the past 2 days.  Discharge instructions explained to pt.

## 2014-07-10 ENCOUNTER — Other Ambulatory Visit (HOSPITAL_COMMUNITY): Payer: Self-pay | Admitting: Neurosurgery

## 2014-07-19 NOTE — Pre-Procedure Instructions (Signed)
Humberto SealsSharon H Humphries  07/19/2014   Your procedure is scheduled on:  Monday, December 21st  [facility] at 630 AM.  Call this number if you have problems the morning of surgery: [phone]   Remember:   Do not eat food or drink liquids after midnight.   Take these medicines the morning of surgery with A SIP OF WATER: wellbutrin, neurontin, hydrocodone or ultram if needed, flonase   Do not wear jewelry, make-up or nail polish.  Do not wear lotions, powders, or perfumes.deodorant.  Do not shave 48 hours prior to surgery. Men may shave face and neck.  Do not bring valuables to the hospital.  [facility] is not responsible for any belongings or valuables.               Contacts, dentures or bridgework may not be worn into surgery.  Leave suitcase in the car. After surgery it may be brought to your room.  For patients admitted to the hospital, discharge time is determined by your treatment team.               Patients discharged the day of surgery will not be allowed to drive home.  Please read over the following fact sheets that you were given: Pain Booklet, Coughing and Deep Breathing, MRSA Information and Surgical Site Infection Prevention  [facility] - Preparing for Surgery  Before surgery, you can play an important role.  Because skin is not sterile, your skin needs to be as free of germs as possible.  You can reduce the number of germs on you skin by washing with CHG (chlorahexidine gluconate) soap before surgery.  CHG is an antiseptic cleaner which kills germs and bonds with the skin to continue killing germs even after washing.  Please DO NOT use if you have an allergy to CHG or antibacterial soaps.  If your skin becomes reddened/irritated stop using the CHG and inform your nurse when you arrive at Short Stay.  Do not shave (including legs and underarms) for at least 48 hours prior to the first CHG shower.  You may shave your face.  Please follow these  instructions carefully:   1.  Shower with CHG Soap the night before surgery and the morning of Surgery.  2.  If you choose to wash your hair, wash your hair first as usual with your normal shampoo.  3.  After you shampoo, rinse your hair and body thoroughly to remove the shampoo.  4.  Use CHG as you would any other liquid soap.  You can apply CHG directly to the skin and wash gently with scrungie or a clean washcloth.  5.  Apply the CHG Soap to your body ONLY FROM THE NECK DOWN.  Do not use on open wounds or open sores.  Avoid contact with your eyes, ears, mouth and genitals (private parts).  Wash genitals (private parts) with your normal soap.  6.  Wash thoroughly, paying special attention to the area where your surgery will be performed.  7.  Thoroughly rinse your body with warm water from the neck down.  8.  DO NOT shower/wash with your normal soap after using and rinsing off the CHG Soap.  9.  Pat yourself dry with a clean towel.            10.  Wear clean pajamas.            11.  Place clean sheets on your bed the night  of your first shower and do not sleep with pets.  Day of Surgery  Do not apply any lotions/deoderants the morning of surgery.  Please wear clean clothes to the hospital/surgery center.

## 2014-07-20 ENCOUNTER — Encounter (HOSPITAL_COMMUNITY)
Admission: RE | Admit: 2014-07-20 | Discharge: 2014-07-20 | Disposition: A | Discharge status: Home / Self Care | Payer: Managed Care, Other (non HMO) | Source: Ambulatory Visit | Attending: Neurosurgery | Admitting: Neurosurgery

## 2014-07-20 ENCOUNTER — Encounter (HOSPITAL_COMMUNITY): Payer: Self-pay

## 2014-07-20 DIAGNOSIS — R609 Edema, unspecified: Secondary | ICD-10-CM | POA: Diagnosis not present

## 2014-07-20 DIAGNOSIS — Z01812 Encounter for preprocedural laboratory examination: Secondary | ICD-10-CM | POA: Diagnosis present

## 2014-07-20 LAB — SURGICAL PCR SCREEN
MRSA, PCR: NEGATIVE
Staphylococcus aureus: POSITIVE — AB

## 2014-07-20 LAB — BASIC METABOLIC PANEL
Anion gap: 12 (ref 5–15)
BUN: 15 mg/dL (ref 6–23)
CO2: 25 mEq/L (ref 19–32)
Calcium: 9.2 mg/dL (ref 8.4–10.5)
Chloride: 101 mEq/L (ref 96–112)
Creatinine, Ser: 0.67 mg/dL (ref 0.50–1.10)
GFR calc Af Amer: >90 mL/min (ref >90)
Glucose, Bld: 92 mg/dL (ref 70–99)
POTASSIUM: 4.1 meq/L (ref 3.7–5.3)
Sodium: 138 mEq/L (ref 137–147)

## 2014-07-20 LAB — CBC
HCT: 37.4 % (ref 36.0–46.0)
Hemoglobin: 12.7 g/dL (ref 12.0–15.0)
MCH: 29.7 pg (ref 26.0–34.0)
MCHC: 34 g/dL (ref 30.0–36.0)
MCV: 87.6 fL (ref 78.0–100.0)
PLATELETS: 323 10*3/uL (ref 150–400)
RBC: 4.27 MIL/uL (ref 3.87–5.11)
RDW: 12.9 % (ref 11.5–15.5)
WBC: 5.7 10*3/uL (ref 4.0–10.5)

## 2014-07-20 LAB — HCG, SERUM, QUALITATIVE: Preg, Serum: NEGATIVE

## 2014-07-20 NOTE — Progress Notes (Signed)
Patient returned call and stated she got message regarding prescription.

## 2014-07-20 NOTE — Progress Notes (Addendum)
PCP: [facility] Tasia CatchingsStephen [facility].  Pt. Denies any cardiac disease. Takes diuretic as needed for  fluid retention.  [facility] [facility] [place]  Left message on pt's phone of pcr results and stated to return my call.

## 2014-07-27 ENCOUNTER — Ambulatory Visit: Payer: Managed Care, Other (non HMO) | Admitting: Nurse Practitioner

## 2014-07-29 MED ORDER — VANCOMYCIN HCL IN DEXTROSE 1-5 GM/200ML-% IV SOLN
1000.0000 mg | INTRAVENOUS | Status: AC
  Administered 2014-07-30: 1000 mg via INTRAVENOUS
  Filled 2014-07-29: qty 200

## 2014-07-30 ENCOUNTER — Ambulatory Visit (HOSPITAL_COMMUNITY): Payer: Managed Care, Other (non HMO) | Admitting: Anesthesiology

## 2014-07-30 ENCOUNTER — Ambulatory Visit (HOSPITAL_COMMUNITY)
Admission: RE | Admit: 2014-07-30 | Discharge: 2014-07-30 | Disposition: A | Discharge status: Home / Self Care | Payer: Managed Care, Other (non HMO) | Source: Ambulatory Visit | Attending: Neurosurgery | Admitting: Neurosurgery

## 2014-07-30 ENCOUNTER — Encounter (HOSPITAL_COMMUNITY)
Admission: RE | Disposition: A | Discharge status: Home / Self Care | Payer: Self-pay | Source: Ambulatory Visit | Attending: Neurosurgery

## 2014-07-30 ENCOUNTER — Encounter (HOSPITAL_COMMUNITY): Payer: Self-pay | Admitting: *Deleted

## 2014-07-30 ENCOUNTER — Ambulatory Visit (HOSPITAL_COMMUNITY): Payer: Managed Care, Other (non HMO)

## 2014-07-30 DIAGNOSIS — M5127 Other intervertebral disc displacement, lumbosacral region: Secondary | ICD-10-CM | POA: Insufficient documentation

## 2014-07-30 DIAGNOSIS — M5126 Other intervertebral disc displacement, lumbar region: Secondary | ICD-10-CM | POA: Diagnosis present

## 2014-07-30 DIAGNOSIS — Z419 Encounter for procedure for purposes other than remedying health state, unspecified: Secondary | ICD-10-CM

## 2014-07-30 HISTORY — PX: LUMBAR LAMINECTOMY/DECOMPRESSION MICRODISCECTOMY: SHX5026

## 2014-07-30 SURGERY — LUMBAR LAMINECTOMY/DECOMPRESSION MICRODISCECTOMY 1 LEVEL
Anesthesia: General | Site: Back | Laterality: Left

## 2014-07-30 MED ORDER — ONDANSETRON HCL 4 MG/2ML IJ SOLN: 4.0000 mg | Freq: Once | INTRAMUSCULAR | Status: DC | PRN

## 2014-07-30 MED ORDER — HEMOSTATIC AGENTS (NO CHARGE) OPTIME
TOPICAL | Status: DC | PRN
  Administered 2014-07-30: 1 via TOPICAL

## 2014-07-30 MED ORDER — LIDOCAINE-EPINEPHRINE 0.5 %-1:200000 IJ SOLN
INTRAMUSCULAR | Status: DC | PRN
  Administered 2014-07-30: 10 mL

## 2014-07-30 MED ORDER — HYDROMORPHONE HCL 1 MG/ML IJ SOLN
INTRAMUSCULAR | Status: AC
  Filled 2014-07-30: qty 1

## 2014-07-30 MED ORDER — ONDANSETRON HCL 4 MG/2ML IJ SOLN
4.0000 mg | INTRAMUSCULAR | Status: DC | PRN
  Administered 2014-07-30: 4 mg via INTRAVENOUS
  Filled 2014-07-30: qty 2

## 2014-07-30 MED ORDER — GABAPENTIN 300 MG PO CAPS
300.0000 mg | ORAL_CAPSULE | Freq: Three times a day (TID) | ORAL | Status: DC
  Filled 2014-07-30 (×2): qty 1

## 2014-07-30 MED ORDER — ROCURONIUM BROMIDE 50 MG/5ML IV SOLN
INTRAVENOUS | Status: AC
  Filled 2014-07-30: qty 1

## 2014-07-30 MED ORDER — HYDROMORPHONE HCL 1 MG/ML IJ SOLN: 0.5000 mg | INTRAMUSCULAR | Status: DC | PRN

## 2014-07-30 MED ORDER — TRAMADOL HCL 50 MG PO TABS: 50.0000 mg | ORAL_TABLET | Freq: Four times a day (QID) | ORAL | Status: DC | PRN

## 2014-07-30 MED ORDER — MIDAZOLAM HCL 2 MG/2ML IJ SOLN
INTRAMUSCULAR | Status: AC
  Filled 2014-07-30: qty 2

## 2014-07-30 MED ORDER — HYDROCODONE-ACETAMINOPHEN 5-325 MG PO TABS: 1.0000 | ORAL_TABLET | Freq: Four times a day (QID) | ORAL | Status: DC | PRN

## 2014-07-30 MED ORDER — FENTANYL CITRATE 0.05 MG/ML IJ SOLN
INTRAMUSCULAR | Status: AC
  Filled 2014-07-30: qty 2

## 2014-07-30 MED ORDER — SENNA 8.6 MG PO TABS: 1.0000 | ORAL_TABLET | Freq: Two times a day (BID) | ORAL | Status: DC

## 2014-07-30 MED ORDER — PROPOFOL 10 MG/ML IV BOLUS
INTRAVENOUS | Status: AC
  Filled 2014-07-30: qty 20

## 2014-07-30 MED ORDER — LEVONORGESTREL 20 MCG/24HR IU IUD: 1.0000 | INTRAUTERINE_SYSTEM | Freq: Once | INTRAUTERINE | Status: DC

## 2014-07-30 MED ORDER — DIPHENHYDRAMINE HCL 50 MG/ML IJ SOLN
INTRAMUSCULAR | Status: DC | PRN
  Administered 2014-07-30: 6.25 mg via INTRAVENOUS

## 2014-07-30 MED ORDER — FLUTICASONE PROPIONATE 50 MCG/ACT NA SUSP
2.0000 | Freq: Every day | NASAL | Status: DC
Start: 2014-07-30 — End: 2014-07-30
  Filled 2014-07-30: qty 16

## 2014-07-30 MED ORDER — PROPOFOL 10 MG/ML IV BOLUS
INTRAVENOUS | Status: DC | PRN
  Administered 2014-07-30: 200 mg via INTRAVENOUS

## 2014-07-30 MED ORDER — MENTHOL 3 MG MT LOZG: 1.0000 | LOZENGE | OROMUCOSAL | Status: DC | PRN

## 2014-07-30 MED ORDER — CYCLOBENZAPRINE HCL 10 MG PO TABS: 10.0000 mg | ORAL_TABLET | Freq: Three times a day (TID) | ORAL | Status: DC | PRN

## 2014-07-30 MED ORDER — SODIUM CHLORIDE 0.9 % IJ SOLN
3.0000 mL | Freq: Two times a day (BID) | INTRAMUSCULAR | Status: DC
  Administered 2014-07-30: 3 mL via INTRAVENOUS

## 2014-07-30 MED ORDER — ZOLPIDEM TARTRATE 5 MG PO TABS: 5.0000 mg | ORAL_TABLET | Freq: Every evening | ORAL | Status: DC | PRN

## 2014-07-30 MED ORDER — GLYCOPYRROLATE 0.2 MG/ML IJ SOLN
INTRAMUSCULAR | Status: AC
  Filled 2014-07-30: qty 3

## 2014-07-30 MED ORDER — FENTANYL CITRATE 0.05 MG/ML IJ SOLN
INTRAMUSCULAR | Status: AC
  Filled 2014-07-30: qty 5

## 2014-07-30 MED ORDER — NEOSTIGMINE METHYLSULFATE 10 MG/10ML IV SOLN
INTRAVENOUS | Status: DC | PRN
  Administered 2014-07-30: 4 mg via INTRAVENOUS

## 2014-07-30 MED ORDER — ACETAMINOPHEN 325 MG PO TABS: 650.0000 mg | ORAL_TABLET | ORAL | Status: DC | PRN

## 2014-07-30 MED ORDER — POTASSIUM CHLORIDE IN NACL 20-0.9 MEQ/L-% IV SOLN
INTRAVENOUS | Status: DC
  Filled 2014-07-30 (×2): qty 1000

## 2014-07-30 MED ORDER — SUCCINYLCHOLINE CHLORIDE 20 MG/ML IJ SOLN
INTRAMUSCULAR | Status: AC
  Filled 2014-07-30: qty 1

## 2014-07-30 MED ORDER — MIDAZOLAM HCL 5 MG/5ML IJ SOLN
INTRAMUSCULAR | Status: DC | PRN
  Administered 2014-07-30: 2 mg via INTRAVENOUS

## 2014-07-30 MED ORDER — OXYCODONE HCL 5 MG PO TABS: 5.0000 mg | ORAL_TABLET | Freq: Once | ORAL | Status: DC | PRN

## 2014-07-30 MED ORDER — ONDANSETRON HCL 4 MG/2ML IJ SOLN
INTRAMUSCULAR | Status: AC
  Filled 2014-07-30: qty 2

## 2014-07-30 MED ORDER — FENTANYL CITRATE 0.05 MG/ML IJ SOLN
INTRAMUSCULAR | Status: DC | PRN
  Administered 2014-07-30 (×2): 50 ug via INTRAVENOUS
  Administered 2014-07-30: 100 ug via INTRAVENOUS
  Administered 2014-07-30: 50 ug via INTRAVENOUS

## 2014-07-30 MED ORDER — PHENOL 1.4 % MT LIQD: 1.0000 | OROMUCOSAL | Status: DC | PRN

## 2014-07-30 MED ORDER — THROMBIN 5000 UNITS EX SOLR
CUTANEOUS | Status: DC | PRN
  Administered 2014-07-30 (×2): 5000 [IU] via TOPICAL

## 2014-07-30 MED ORDER — POLYETHYLENE GLYCOL 3350 17 G PO PACK
17.0000 g | PACK | Freq: Every day | ORAL | Status: DC | PRN
  Filled 2014-07-30: qty 1

## 2014-07-30 MED ORDER — ACETAMINOPHEN 650 MG RE SUPP: 650.0000 mg | RECTAL | Status: DC | PRN

## 2014-07-30 MED ORDER — METHYLPREDNISOLONE ACETATE 80 MG/ML IJ SUSP
INTRAMUSCULAR | Status: DC | PRN
  Administered 2014-07-30: 80 mg

## 2014-07-30 MED ORDER — EPHEDRINE SULFATE 50 MG/ML IJ SOLN
INTRAMUSCULAR | Status: DC | PRN
  Administered 2014-07-30 (×2): 5 mg via INTRAVENOUS

## 2014-07-30 MED ORDER — DIAZEPAM 5 MG PO TABS
5.0000 mg | ORAL_TABLET | Freq: Four times a day (QID) | ORAL | Status: DC | PRN
  Administered 2014-07-30: 5 mg via ORAL
  Filled 2014-07-30: qty 1

## 2014-07-30 MED ORDER — ARTIFICIAL TEARS OP OINT
TOPICAL_OINTMENT | OPHTHALMIC | Status: DC | PRN
  Administered 2014-07-30: 1 via OPHTHALMIC

## 2014-07-30 MED ORDER — 0.9 % SODIUM CHLORIDE (POUR BTL) OPTIME
TOPICAL | Status: DC | PRN
  Administered 2014-07-30: 1000 mL

## 2014-07-30 MED ORDER — HYDROMORPHONE HCL 1 MG/ML IJ SOLN
0.2500 mg | INTRAMUSCULAR | Status: DC | PRN
  Administered 2014-07-30 (×4): 0.5 mg via INTRAVENOUS

## 2014-07-30 MED ORDER — OXYCODONE-ACETAMINOPHEN 5-325 MG PO TABS
1.0000 | ORAL_TABLET | ORAL | Status: DC | PRN
  Filled 2014-07-30: qty 2

## 2014-07-30 MED ORDER — BUPROPION HCL ER (XL) 150 MG PO TB24
150.0000 mg | ORAL_TABLET | Freq: Every day | ORAL | Status: DC
  Filled 2014-07-30: qty 1

## 2014-07-30 MED ORDER — LIDOCAINE HCL (CARDIAC) 20 MG/ML IV SOLN
INTRAVENOUS | Status: AC
  Filled 2014-07-30: qty 5

## 2014-07-30 MED ORDER — DIPHENHYDRAMINE HCL 50 MG/ML IJ SOLN
INTRAMUSCULAR | Status: AC
  Filled 2014-07-30: qty 1

## 2014-07-30 MED ORDER — HYDROCHLOROTHIAZIDE 25 MG PO TABS
25.0000 mg | ORAL_TABLET | Freq: Every day | ORAL | Status: DC | PRN
  Filled 2014-07-30: qty 1

## 2014-07-30 MED ORDER — EPHEDRINE SULFATE 50 MG/ML IJ SOLN
INTRAMUSCULAR | Status: AC
  Filled 2014-07-30: qty 1

## 2014-07-30 MED ORDER — LACTATED RINGERS IV SOLN
INTRAVENOUS | Status: DC | PRN
  Administered 2014-07-30 (×2): via INTRAVENOUS

## 2014-07-30 MED ORDER — LACTATED RINGERS IV SOLN
INTRAVENOUS | Status: DC
  Administered 2014-07-30: 07:00:00 via INTRAVENOUS

## 2014-07-30 MED ORDER — ROCURONIUM BROMIDE 100 MG/10ML IV SOLN
INTRAVENOUS | Status: DC | PRN
  Administered 2014-07-30: 40 mg via INTRAVENOUS

## 2014-07-30 MED ORDER — FENTANYL CITRATE 0.05 MG/ML IJ SOLN
INTRAMUSCULAR | Status: DC | PRN
  Administered 2014-07-30: 100 ug via INTRAVENOUS

## 2014-07-30 MED ORDER — ONDANSETRON HCL 4 MG/2ML IJ SOLN
INTRAMUSCULAR | Status: DC | PRN
  Administered 2014-07-30: 4 mg via INTRAVENOUS

## 2014-07-30 MED ORDER — HYDROCODONE-ACETAMINOPHEN 5-325 MG PO TABS
1.0000 | ORAL_TABLET | ORAL | Status: DC | PRN
  Administered 2014-07-30: 1 via ORAL
  Filled 2014-07-30: qty 1

## 2014-07-30 MED ORDER — SODIUM CHLORIDE 0.9 % IJ SOLN: 3.0000 mL | INTRAMUSCULAR | Status: DC | PRN

## 2014-07-30 MED ORDER — LIDOCAINE HCL (CARDIAC) 20 MG/ML IV SOLN
INTRAVENOUS | Status: DC | PRN
  Administered 2014-07-30: 80 mg via INTRAVENOUS

## 2014-07-30 MED ORDER — SODIUM CHLORIDE 0.9 % IJ SOLN
INTRAMUSCULAR | Status: AC
  Filled 2014-07-30: qty 10

## 2014-07-30 MED ORDER — KETOROLAC TROMETHAMINE 30 MG/ML IJ SOLN
30.0000 mg | Freq: Four times a day (QID) | INTRAMUSCULAR | Status: DC
  Administered 2014-07-30: 30 mg via INTRAVENOUS
  Filled 2014-07-30: qty 1

## 2014-07-30 MED ORDER — PSEUDOEPHEDRINE HCL ER 120 MG PO TB12
120.0000 mg | ORAL_TABLET | Freq: Every day | ORAL | Status: DC | PRN
Start: 2014-07-30 — End: 2014-07-30
  Filled 2014-07-30: qty 1

## 2014-07-30 MED ORDER — NEOSTIGMINE METHYLSULFATE 10 MG/10ML IV SOLN
INTRAVENOUS | Status: AC
  Filled 2014-07-30: qty 1

## 2014-07-30 MED ORDER — OXYCODONE HCL 5 MG/5ML PO SOLN: 5.0000 mg | Freq: Once | ORAL | Status: DC | PRN

## 2014-07-30 MED ORDER — ARTIFICIAL TEARS OP OINT
TOPICAL_OINTMENT | OPHTHALMIC | Status: AC
  Filled 2014-07-30: qty 3.5

## 2014-07-30 MED ORDER — DEXAMETHASONE SODIUM PHOSPHATE 4 MG/ML IJ SOLN
INTRAMUSCULAR | Status: DC | PRN
  Administered 2014-07-30: 4 mg via INTRAVENOUS

## 2014-07-30 MED ORDER — GLYCOPYRROLATE 0.2 MG/ML IJ SOLN
INTRAMUSCULAR | Status: DC | PRN
  Administered 2014-07-30: 0.6 mg via INTRAVENOUS

## 2014-07-30 SURGICAL SUPPLY — 54 items
BAG DECANTER FOR FLEXI CONT (MISCELLANEOUS) ×2 IMPLANT
BENZOIN TINCTURE PRP APPL 2/3 (GAUZE/BANDAGES/DRESSINGS) IMPLANT
BLADE 11 SAFETY STRL DISP (BLADE) ×2 IMPLANT
BLADE CLIPPER SURG (BLADE) IMPLANT
BUR MATCHSTICK NEURO 3.0 LAGG (BURR) ×2 IMPLANT
CANISTER SUCT 3000ML (MISCELLANEOUS) ×2 IMPLANT
CONT SPEC 4OZ CLIKSEAL STRL BL (MISCELLANEOUS) ×2 IMPLANT
DECANTER SPIKE VIAL GLASS SM (MISCELLANEOUS) ×2 IMPLANT
DRAPE LAPAROTOMY 100X72X124 (DRAPES) ×2 IMPLANT
DRAPE MICROSCOPE LEICA (MISCELLANEOUS) ×2 IMPLANT
DRAPE POUCH INSTRU U-SHP 10X18 (DRAPES) ×2 IMPLANT
DRAPE SURG 17X23 STRL (DRAPES) ×2 IMPLANT
DURAPREP 26ML APPLICATOR (WOUND CARE) ×2 IMPLANT
ELECT REM PT RETURN 9FT ADLT (ELECTROSURGICAL) ×2
ELECTRODE REM PT RTRN 9FT ADLT (ELECTROSURGICAL) ×1 IMPLANT
GAUZE SPONGE 4X4 12PLY STRL (GAUZE/BANDAGES/DRESSINGS) IMPLANT
GAUZE SPONGE 4X4 16PLY XRAY LF (GAUZE/BANDAGES/DRESSINGS) IMPLANT
GLOVE BIOGEL M 8.0 STRL (GLOVE) ×2 IMPLANT
GLOVE BIOGEL PI IND STRL 7.0 (GLOVE) ×3 IMPLANT
GLOVE BIOGEL PI INDICATOR 7.0 (GLOVE) ×3
GLOVE ECLIPSE 6.5 STRL STRAW (GLOVE) ×2 IMPLANT
GLOVE EXAM NITRILE LRG STRL (GLOVE) IMPLANT
GLOVE EXAM NITRILE MD LF STRL (GLOVE) IMPLANT
GLOVE EXAM NITRILE XL STR (GLOVE) IMPLANT
GLOVE EXAM NITRILE XS STR PU (GLOVE) IMPLANT
GLOVE SS BIOGEL STRL SZ 6.5 (GLOVE) ×3 IMPLANT
GLOVE SUPERSENSE BIOGEL SZ 6.5 (GLOVE) ×3
GOWN STRL REUS W/ TWL LRG LVL3 (GOWN DISPOSABLE) ×4 IMPLANT
GOWN STRL REUS W/ TWL XL LVL3 (GOWN DISPOSABLE) IMPLANT
GOWN STRL REUS W/TWL 2XL LVL3 (GOWN DISPOSABLE) IMPLANT
GOWN STRL REUS W/TWL LRG LVL3 (GOWN DISPOSABLE) ×4
GOWN STRL REUS W/TWL XL LVL3 (GOWN DISPOSABLE)
KIT BASIN OR (CUSTOM PROCEDURE TRAY) ×2 IMPLANT
KIT ROOM TURNOVER OR (KITS) ×2 IMPLANT
LIQUID BAND (GAUZE/BANDAGES/DRESSINGS) ×2 IMPLANT
NEEDLE BLUNT 18X1 FOR OR ONLY (NEEDLE) ×2 IMPLANT
NEEDLE HYPO 25X1 1.5 SAFETY (NEEDLE) ×2 IMPLANT
NEEDLE SPNL 18GX3.5 QUINCKE PK (NEEDLE) IMPLANT
NS IRRIG 1000ML POUR BTL (IV SOLUTION) ×2 IMPLANT
PACK LAMINECTOMY NEURO (CUSTOM PROCEDURE TRAY) ×2 IMPLANT
PAD ARMBOARD 7.5X6 YLW CONV (MISCELLANEOUS) ×6 IMPLANT
RUBBERBAND STERILE (MISCELLANEOUS) ×4 IMPLANT
SPONGE LAP 4X18 X RAY DECT (DISPOSABLE) IMPLANT
SPONGE SURGIFOAM ABS GEL SZ50 (HEMOSTASIS) ×2 IMPLANT
STRIP CLOSURE SKIN 1/2X4 (GAUZE/BANDAGES/DRESSINGS) IMPLANT
SUT VIC AB 0 CT1 18XCR BRD8 (SUTURE) ×1 IMPLANT
SUT VIC AB 0 CT1 8-18 (SUTURE) ×1
SUT VIC AB 2-0 CT1 18 (SUTURE) ×2 IMPLANT
SUT VIC AB 3-0 SH 8-18 (SUTURE) ×2 IMPLANT
SYR 20ML ECCENTRIC (SYRINGE) ×2 IMPLANT
SYR 5ML LL (SYRINGE) ×2 IMPLANT
TOWEL OR 17X24 6PK STRL BLUE (TOWEL DISPOSABLE) ×2 IMPLANT
TOWEL OR 17X26 10 PK STRL BLUE (TOWEL DISPOSABLE) ×2 IMPLANT
WATER STERILE IRR 1000ML POUR (IV SOLUTION) ×2 IMPLANT

## 2014-07-30 NOTE — Anesthesia Procedure Notes (Signed)
Procedure Name: Intubation Date/Time: 07/30/2014 8:20 AM Performed by: Angelica PouSMITH, Sherif Millspaugh PIZZICARA Pre-anesthesia Checklist: Patient identified, Timeout performed, Emergency Drugs available, Suction available and Patient being monitored Patient Re-evaluated:Patient Re-evaluated prior to inductionOxygen Delivery Method: Circle system utilized Preoxygenation: Pre-oxygenation with 100% oxygen Intubation Type: IV induction Ventilation: Mask ventilation without difficulty and Oral airway inserted - appropriate to patient size Laryngoscope Size: Mac and 3 Grade View: Grade I Tube type: Oral Tube size: 7.0 mm Number of attempts: 1 Airway Equipment and Method: Stylet Placement Confirmation: ETT inserted through vocal cords under direct vision,  breath sounds checked- equal and bilateral and positive ETCO2 Secured at: 22 cm Tube secured with: Tape Dental Injury: Teeth and Oropharynx as per pre-operative assessment

## 2014-07-30 NOTE — Transfer of Care (Signed)
Immediate Anesthesia Transfer of Care Note  Patient: Ashley SealsSharon H Ozment  Procedure(s) Performed: Procedure(s) with comments: Left Lumbar five-Sacral one microdiskectomy (Left) - Left Lumbar five-Sacral one microdiskectomy  Patient Location: PACU  Anesthesia Type:General  Level of Consciousness: awake, oriented, patient cooperative and lethargic  Airway & Oxygen Therapy: Patient Spontanous Breathing and Patient connected to nasal cannula oxygen  Post-op Assessment: Report given to PACU RN, Post -op Vital signs reviewed and stable and Patient moving all extremities X 4  Post vital signs: Reviewed and stable  Complications: No apparent anesthesia complications

## 2014-07-30 NOTE — Plan of Care (Signed)
Problem: Consults Goal: Diagnosis - Spinal Surgery Outcome: Completed/Met Date Met:  07/30/14 Microdiscectomy

## 2014-07-30 NOTE — H&P (Signed)
  BP 128/67 mmHg  Pulse 81  Temp(Src) 97.9 F (36.6 C) (Oral)  Resp 16  Ht 5' 4.5" (1.638 m)  Wt 81.959 kg (180 lb 11 oz)  BMI 30.55 kg/m2  SpO2 100%   HOPI:                                                   [name] returned today with a myelogram and post myelogram CT.     DATA:                                                  What it shows is that she has soft tissue mass, either scar or disc, it won't make a difference, on the left side at L5-S1 compromising the left S1 root. You do not see it fill whatsoever. This is the reason she has had her pain. She has no improvement with the injections. On the sagittal and axial films you can see that hump of material. Without question this is the problem and I do believe she will be better. It is eccentric to the left. She has problems no where else in the lumbar spine. We went over this in great detail.             INTERVAL PFSH:                                  She takes Naprosyn, Sudafed, and tramadol. SHE IS ALLERGIC TO CIPROFLOXACIN, AND PENICILLIN.                    PHYSICAL EXAMINATION:                    On examination she is alert and oriented x4. She is quite upset, but just upset because she didn't want to hear that surgery might be necessary again. She has normal muscle tone, bulk, and coordination. Speech is clear and fluent. Tongue and uvula are in the midline. Shoulder shrug is normal. Hearing intact to voice.     Vital signs were taken today and they are as follows: Height is 64.5 inches. Weight is 183.8 pounds. BMI is 31.06. Blood pressure is 120/82. Pulse is 83. Respiratory rate is 14. Temperature is 98.2 F.               IMPRESSION/PLAN:                             I will see her on 07/30/2014 for a redo lumbar laminectomy at L5-S1.

## 2014-07-30 NOTE — Anesthesia Postprocedure Evaluation (Signed)
  Anesthesia Post-op Note  Patient: Ashley SealsSharon H Eifler  Procedure(s) Performed: Procedure(s) with comments: Left Lumbar five-Sacral one microdiskectomy (Left) - Left Lumbar five-Sacral one microdiskectomy  Patient Location: PACU  Anesthesia Type:General  Level of Consciousness: awake, alert  and oriented  Airway and Oxygen Therapy: Patient Spontanous Breathing and Patient connected to nasal cannula oxygen  Post-op Pain: mild  Post-op Assessment: Post-op Vital signs reviewed, Patient's Cardiovascular Status Stable, Respiratory Function Stable, Patent Airway, No signs of Nausea or vomiting and Pain level controlled  Post-op Vital Signs: stable  Last Vitals:  Filed Vitals:   07/30/14 1146  BP:   Pulse: 72  Temp: 36.7 C  Resp: 11    Complications: No apparent anesthesia complications

## 2014-07-30 NOTE — Discharge Instructions (Addendum)
Lumbar Discectomy Care After A discectomy involves removal of discmaterial (the cartilage-like structures located between the bones of the back). It is done to relieve pressure on nerve roots. It can be used as a treatment for a back problem. The time in surgery depends on the findings in surgery and what is necessary to correct the problems. HOME CARE INSTRUCTIONS   Check the cut (incision) made by the surgeon twice a day for signs of infection. Some signs of infection may include:   A foul smelling, greenish or yellowish discharge from the wound.   Increased pain.   Increased redness over the incision (operative) site.   The skin edges may separate.   Flu-like symptoms (problems).   A temperature above 101.5 F (38.6 C).   Change your bandages in about 24 to 36 hours following surgery or as directed.   You may shower tomorrow. Avoid bathtubs, swimming pools and hot tubs for three weeks or until your incision has healed completely. If you have stitches or staples, they may be removed 2 to 3 weeks after surgery, or as directed by your doctor. This may be done by your doctor or caregiver.   You may walk as much as you like. No need to exercise at this time. Limit lifting to ~10lbs.  Weight reduction may be beneficial if you are overweight.   Daily exercise is helpful to prevent the return of problems. Walking is permitted. You may use a treadmill without an incline. Cut down on activities and exercise if you have discomfort. You may also go up and down stairs as much as you can tolerate.   DO NOT lift anything heavier than 10 . Avoid bending or twisting at the waist. Always bend your knees when lifting.   Maintain strength and range of motion as instructed.   Do not drive for 2 to 3 weeks, or as directed by your doctors. You may be a passenger for 20 to 30 minute trips. Lying back in the passenger seat may be more comfortable for you. Always wear a seatbelt.   Limit your sitting in  a regular chair to 20 to 30 minutes at a time. There are no limitations for sitting in a recliner. You should lie down or walk in between sitting periods.   Only take over-the-counter or prescription medicines for pain, discomfort, or fever as directed by your caregiver.  SEEK MEDICAL CARE IF:   There is increased bleeding (more than a small spot) from the wound.   You notice redness, swelling, or increasing pain in the wound.   Pus is coming from wound.   You develop an unexplained oral temperature above 102 F (38.9 C) develops.   You notice a foul smell coming from the wound or dressing.   You have increasing pain in your wound.  SEEK IMMEDIATE MEDICAL CARE IF:   You develop a rash.   You have difficulty breathing.   You develop any allergic problems to medicines given.     Wound Care Leave incision open to air. You may shower. Do not scrub directly on incision.  Do not put any creams, lotions, or ointments on incision. Activity Walk each and every day, increasing distance each day. No lifting greater than 5 lbs.  Avoid bending, arching, and twisting. No driving for 2 weeks; may ride as a passenger locally. If provided with back brace, wear when out of bed.  It is not necessary to wear in bed. Diet Resume your normal diet.  Return to Work Will be discussed at you follow up appointment. Call Your Doctor If Any of These Occur Redness, drainage, or swelling at the wound.  Temperature greater than 101 degrees. Severe pain not relieved by pain medication. Incision starts to come apart. Follow Up Appt Call today for appointment in 3 weeks ([phone]) or for problems.  If you have any hardware placed in your spine, you will need an x-ray before your appointment. 

## 2014-07-30 NOTE — Discharge Summary (Signed)
  Physician Discharge Summary  Patient ID: Ashley SealsSharon H Tufaro MRN: [id number] DOB/AGE: [birth year] 46 y.o.  Admit date: 07/30/2014 Discharge date: 07/30/2014  Admission Diagnoses:HNP left L5/S1  Discharge Diagnoses:  Active Problems:   HNP (herniated nucleus pulposus), lumbar   Discharged Condition: good  Hospital Course: [name] was admitted and taken to the operating room for a redo discetomy at L5/S1. She has done well, is voiding, and tolerating a regular diet. Her wound is clean, dry and without signs of infection. She is moving all extremities well.   Treatments: surgery: Redo L5/S1 left discetomy  Discharge Exam: Blood pressure 95/48, pulse 90, temperature 98.7 F (37.1 C), temperature source Oral, resp. rate 18, height 5' 4.5" (1.638 m), weight 81.959 kg (180 lb 11 oz), SpO2 97 %. General appearance: alert, cooperative and appears stated age Neurologic: Alert and oriented X 3, normal strength and tone. Normal symmetric reflexes. Normal coordination and gait  Disposition: 01-Home or Self Care lumbar herniated disc    Medication List    TAKE these medications        ALEVE 220 MG tablet  Generic drug:  naproxen sodium  Take 440 mg by mouth 2 (two) times daily with a meal.     buPROPion 150 MG 24 hr tablet  Commonly known as:  WELLBUTRIN XL  Take 1 tablet (150 mg total) by mouth daily.     cefPROZIL 500 MG tablet  Commonly known as:  CEFZIL  Take 1 tablet (500 mg total) by mouth 2 (two) times daily.     cyclobenzaprine 10 MG tablet  Commonly known as:  FLEXERIL  Take 1 tablet (10 mg total) by mouth 3 (three) times daily as needed for muscle spasms.     fluconazole 150 MG tablet  Commonly known as:  DIFLUCAN  Take 1 tablet (150 mg total) by mouth once. 3 days apart     fluticasone 50 MCG/ACT nasal spray  Commonly known as:  FLONASE  Place 2 sprays into both nostrils daily.     gabapentin 300 MG capsule  Commonly known as:  NEURONTIN  One po TID for  nerve pain     hydrochlorothiazide 25 MG tablet  Commonly known as:  HYDRODIURIL  Take 1 tablet (25 mg total) by mouth daily as needed (for fluid retention).     HYDROcodone-acetaminophen 10-325 MG per tablet  Commonly known as:  NORCO  Take 1 tablet by mouth every 8 (eight) hours as needed.     HYDROcodone-acetaminophen 5-325 MG per tablet  Commonly known as:  NORCO/VICODIN  Take 1 tablet by mouth every 6 (six) hours as needed for moderate pain.     levonorgestrel 20 MCG/24HR IUD  Commonly known as:  MIRENA  1 each by Intrauterine route once. Implanted August 2013     phentermine 37.5 MG tablet  Commonly known as:  ADIPEX-P  Take 1 tablet (37.5 mg total) by mouth daily before breakfast.     pseudoephedrine 120 MG 12 hr tablet  Commonly known as:  SUDAFED  Take 120 mg by mouth daily as needed for congestion.     traMADol 50 MG tablet  Commonly known as:  ULTRAM  Take 50 mg by mouth every 6 (six) hours as needed.         Signed: Maryalyce Sanjuan L 07/30/2014, 6:50 PM

## 2014-07-30 NOTE — Anesthesia Preprocedure Evaluation (Addendum)
Anesthesia Evaluation  Patient identified by MRN, date of birth, ID band Patient awake    Reviewed: Allergy & Precautions, H&P , NPO status , Patient's Chart, lab work & pertinent test results  Airway Mallampati: II  TM Distance: >3 FB Neck ROM: Full    Dental  (+) Teeth Intact, Dental Advisory Given   Pulmonary  breath sounds clear to auscultation        Cardiovascular Rhythm:Regular Rate:Normal     Neuro/Psych    GI/Hepatic   Endo/Other    Renal/GU      Musculoskeletal   Abdominal (+) + obese,   Peds  Hematology   Anesthesia Other Findings   Reproductive/Obstetrics                            Anesthesia Physical Anesthesia Plan  ASA: II  Anesthesia Plan: General   Post-op Pain Management:    Induction: Intravenous  Airway Management Planned: Oral ETT  Additional Equipment:   Intra-op Plan:   Post-operative Plan:   Informed Consent: I have reviewed the patients History and Physical, chart, labs and discussed the procedure including the risks, benefits and alternatives for the proposed anesthesia with the patient or authorized representative who has indicated his/her understanding and acceptance.   Dental advisory given  Plan Discussed with: CRNA and Anesthesiologist  Anesthesia Plan Comments: (HNP L5-S1 H/O Post-op N/V  Seasonal affective disorder  Plan GA with oral ETT  Kipp Broodavid Joslin)       Anesthesia Quick Evaluation

## 2014-07-30 NOTE — Op Note (Signed)
07/30/2014  10:17 AM  PATIENT:  Ashley Mcintosh  48 y.o. female  PRE-OPERATIVE DIAGNOSIS: recurrent L5/S1 left lumbar herniated disc  POST-OPERATIVE DIAGNOSIS:  recurrent L5/S1 left lumbar herniated disc PROCEDURE:  Procedure(s): Left Lumbar five-Sacral one redo microdiskectomy  SURGEON:   Surgeon(s): Coletta MemosKyle Anthone Prieur, MD Karn CassisErnesto M Botero, MD  ASSISTANTS:Botero, Lynne LoganErnesto  ANESTHESIA:   general  EBL:  Total I/O In: 1000 [I.V.:1000] Out: -   BLOOD ADMINISTERED:none  CELL SAVER GIVEN:none  COUNT:per nursing  DRAINS: none   SPECIMEN:  No Specimen  DICTATION: Ashley Mcintosh was taken to the operating room, intubated and placed under a general anesthetic without difficulty. He was positioned prone on a Wilson frame with all pressure points padded. His back was prepped and draped in a sterile manner. I opened the skin with a 10 blade and carried the dissection down to the thoracolumbar fascia. I used both sharp dissection and the monopolar cautery to expose the lamina of L5, and S1. I confirmed my location with an intraoperative xray.  I used the drill, Kerrison punches, and curettes to perform a semihemilaminectomy of L5. I used the punches to remove the ligamentum flavum to expose the thecal sac. I brought the microscope into the operative field and with Dr.Botero's assistance we started our decompression of the spinal canal, thecal sac and S1 root(s). I cauterized epidural veins overlying the disc space then divided them sharply. I opened the disc space with a 11 blade and proceeded with the discectomy. I used pituitary rongeurs, curettes, and other instruments to remove disc material. After the discectomy was completed we inspected the S1 nerve root and felt it was well decompressed. I explored rostrally, laterally, medially, and caudally and was satisfied with the decompression. I irrigated the wound, then closed in layers. I approximated the thoracolumbar fascia, subcutaneous, and  subcuticular planes with vicryl sutures. I used dermabond for a sterile dressing.   PLAN OF CARE: Admit for overnight observation  PATIENT DISPOSITION:  PACU - hemodynamically stable.   Delay start of Pharmacological VTE agent (>24hrs) due to surgical blood loss or risk of bleeding:  yes

## 2014-07-31 ENCOUNTER — Encounter (HOSPITAL_COMMUNITY): Payer: Self-pay | Admitting: Neurosurgery

## 2014-07-31 NOTE — Progress Notes (Signed)
Patient alert and oriented, mae's well, voiding adequate amount of urine, swallowing without difficulty, no c/o pain. Patient discharged home with family. Script and discharged instructions given to patient. Patient and family stated understanding of d/c instructions given and has an appointment with MD. Aisha Nakayla Rorabaugh RN 

## 2014-10-29 ENCOUNTER — Encounter: Payer: Self-pay | Admitting: Nurse Practitioner

## 2014-10-29 ENCOUNTER — Other Ambulatory Visit: Payer: Self-pay | Admitting: Nurse Practitioner

## 2014-10-29 MED ORDER — HYDROCHLOROTHIAZIDE 25 MG PO TABS: 25.0000 mg | ORAL_TABLET | Freq: Every day | ORAL | Status: DC | PRN

## 2014-11-12 ENCOUNTER — Encounter: Payer: Self-pay | Admitting: Family Medicine

## 2014-11-12 NOTE — Telephone Encounter (Signed)
Last seen 04/2014

## 2014-11-13 ENCOUNTER — Other Ambulatory Visit: Payer: Self-pay | Admitting: *Deleted

## 2014-11-13 MED ORDER — PHENTERMINE HCL 37.5 MG PO TABS: 37.5000 mg | ORAL_TABLET | Freq: Every day | ORAL | Status: DC

## 2014-11-16 ENCOUNTER — Other Ambulatory Visit: Payer: Self-pay | Admitting: *Deleted

## 2014-11-16 MED ORDER — PHENTERMINE HCL 37.5 MG PO TABS: 37.5000 mg | ORAL_TABLET | Freq: Every day | ORAL | Status: DC

## 2014-12-07 ENCOUNTER — Ambulatory Visit (INDEPENDENT_AMBULATORY_CARE_PROVIDER_SITE_OTHER): Payer: Managed Care, Other (non HMO) | Admitting: Nurse Practitioner

## 2014-12-07 ENCOUNTER — Encounter: Payer: Self-pay | Admitting: Nurse Practitioner

## 2014-12-07 VITALS — BP 98/72 | Ht 64.0 in | Wt 181.0 lb

## 2014-12-07 DIAGNOSIS — R7301 Impaired fasting glucose: Secondary | ICD-10-CM | POA: Diagnosis not present

## 2014-12-07 MED ORDER — PHENTERMINE HCL 37.5 MG PO TABS: 37.5000 mg | ORAL_TABLET | Freq: Every day | ORAL | Status: DC

## 2014-12-09 ENCOUNTER — Encounter: Payer: Self-pay | Admitting: Nurse Practitioner

## 2014-12-09 NOTE — Progress Notes (Signed)
Subjective:  Presents for recheck. Doing well after back surgery. Has slowly resumed normal activities. Would like to continue Phentermine for now.   Objective:   BP 98/72 mmHg  Ht 5\' 4"  (1.626 m)  Wt 181 lb (82.101 kg)  BMI 31.05 kg/m2 NAD. Alert, oriented. Lungs clear. Heart RRR.   Assessment:  Problem List Items Addressed This Visit      Endocrine   Impaired fasting glucose - Primary     Other   Morbid obesity   Relevant Medications   phentermine (ADIPEX-P) 37.5 MG tablet     Plan:  Meds ordered this encounter  Medications  . phentermine (ADIPEX-P) 37.5 MG tablet    Sig: Take 1 tablet (37.5 mg total) by mouth daily before breakfast.    Dispense:  30 tablet    Refill:  2    Order Specific Question:  Supervising Provider    Answer:  Merlyn AlbertLUKING, WILLIAM S [[id number]]   Continue Phentermine for now. DC med if no weight loss.  Given information on Contrave; is off all pain meds.

## 2014-12-10 ENCOUNTER — Encounter: Payer: Self-pay | Admitting: Nurse Practitioner

## 2014-12-14 ENCOUNTER — Encounter: Payer: Self-pay | Admitting: Nurse Practitioner

## 2014-12-14 ENCOUNTER — Telehealth: Payer: Self-pay | Admitting: Nurse Practitioner

## 2014-12-14 ENCOUNTER — Other Ambulatory Visit: Payer: Self-pay | Admitting: Nurse Practitioner

## 2014-12-14 MED ORDER — NALTREXONE-BUPROPION HCL ER 8-90 MG PO TB12: ORAL_TABLET | ORAL | Status: DC

## 2014-12-14 NOTE — Telephone Encounter (Signed)
Patient notified

## 2014-12-14 NOTE — Telephone Encounter (Signed)
Sorry. Missed that part. I will send directly in to pharmacy.

## 2014-12-14 NOTE — Telephone Encounter (Signed)
Naltrexone-Bupropion HCl ER (CONTRAVE) 8-90 MG TB12  Pt states she needs to be on 60 days in order to use the coupon book  Per the Pharmacist  Please send escript

## 2014-12-19 ENCOUNTER — Encounter: Payer: Self-pay | Admitting: Nurse Practitioner

## 2014-12-20 ENCOUNTER — Other Ambulatory Visit: Payer: Self-pay | Admitting: Nurse Practitioner

## 2014-12-20 MED ORDER — NALTREXONE-BUPROPION HCL ER 8-90 MG PO TB12: ORAL_TABLET | ORAL | Status: DC

## 2014-12-24 ENCOUNTER — Encounter: Payer: Self-pay | Admitting: Family Medicine

## 2014-12-25 ENCOUNTER — Encounter: Payer: Self-pay | Admitting: Family Medicine

## 2014-12-25 ENCOUNTER — Telehealth: Payer: Self-pay | Admitting: Family Medicine

## 2014-12-25 ENCOUNTER — Ambulatory Visit (INDEPENDENT_AMBULATORY_CARE_PROVIDER_SITE_OTHER): Payer: Managed Care, Other (non HMO) | Admitting: Family Medicine

## 2014-12-25 VITALS — BP 106/88 | Temp 98.9°F | Ht 64.0 in | Wt 181.0 lb

## 2014-12-25 DIAGNOSIS — J329 Chronic sinusitis, unspecified: Secondary | ICD-10-CM | POA: Diagnosis not present

## 2014-12-25 MED ORDER — FLUCONAZOLE 150 MG PO TABS: 150.0000 mg | ORAL_TABLET | Freq: Once | ORAL | Status: DC

## 2014-12-25 MED ORDER — CEFDINIR 300 MG PO CAPS
300.0000 mg | ORAL_CAPSULE | Freq: Two times a day (BID) | ORAL | Status: DC
Start: 2014-12-25 — End: 2016-02-07

## 2014-12-25 NOTE — Progress Notes (Signed)
   Subjective:    Patient ID: Ashley Mcintosh, female    DOB: [birth year], 49 y.o.   MRN: [id number]  HPI Patient here due to sinus pressure and headache and cough times 5 days. Patient reports being tired and lethargic since onset day too.  No fever currently. OTC Ibuprofen and DayQuil tried effectiveness not known.   No throat pain  Mostly up top with cong and rain  Using dayquil and nyquil  Low-grade fever.  Diminished energy.     Review of Systems No vomiting no diarrhea no rash no chills    Objective:   Physical Exam alert vitals stable HET moderate his congestion frontal tenderness pharynx normal neck supple lungs clear. Heart regular in rhythm.      Assessment & Plan:  Impression 1 acute rhinosinusitis plan antibiotics prescribed. Symptom care discussed. Warning signs discussed. WSL

## 2014-12-25 NOTE — Telephone Encounter (Signed)
Rx sent electronically to pharmacy. Patient notified. 

## 2014-12-25 NOTE — Telephone Encounter (Signed)
Pt would like a diflucan X2  To be sent in with her antibiotic for today please

## 2015-01-08 ENCOUNTER — Encounter: Payer: Self-pay | Admitting: Nurse Practitioner

## 2015-01-08 ENCOUNTER — Other Ambulatory Visit: Payer: Self-pay | Admitting: Nurse Practitioner

## 2015-01-08 MED ORDER — NALTREXONE-BUPROPION HCL ER 8-90 MG PO TB12: ORAL_TABLET | ORAL | Status: DC

## 2015-01-16 ENCOUNTER — Other Ambulatory Visit: Payer: Self-pay

## 2015-01-16 DIAGNOSIS — Z1231 Encounter for screening mammogram for malignant neoplasm of breast: Secondary | ICD-10-CM

## 2015-02-12 ENCOUNTER — Ambulatory Visit
Admission: RE | Admit: 2015-02-12 | Discharge: 2015-02-12 | Disposition: A | Discharge status: Home / Self Care | Payer: Managed Care, Other (non HMO) | Source: Ambulatory Visit

## 2015-02-12 DIAGNOSIS — Z1231 Encounter for screening mammogram for malignant neoplasm of breast: Secondary | ICD-10-CM

## 2015-04-01 ENCOUNTER — Encounter: Payer: Self-pay | Admitting: Nurse Practitioner

## 2015-04-04 ENCOUNTER — Other Ambulatory Visit: Payer: Self-pay | Admitting: Nurse Practitioner

## 2015-04-04 MED ORDER — PHENTERMINE HCL 37.5 MG PO TABS: 37.5000 mg | ORAL_TABLET | Freq: Every day | ORAL | Status: DC

## 2015-04-29 ENCOUNTER — Encounter: Payer: Self-pay | Admitting: Nurse Practitioner

## 2015-05-01 ENCOUNTER — Other Ambulatory Visit: Payer: Self-pay | Admitting: Nurse Practitioner

## 2015-05-01 MED ORDER — PHENTERMINE HCL 37.5 MG PO TABS: 37.5000 mg | ORAL_TABLET | Freq: Every day | ORAL | Status: DC

## 2015-08-16 ENCOUNTER — Encounter: Payer: Self-pay | Admitting: Nurse Practitioner

## 2015-08-16 ENCOUNTER — Ambulatory Visit (INDEPENDENT_AMBULATORY_CARE_PROVIDER_SITE_OTHER): Payer: 59 | Admitting: Nurse Practitioner

## 2015-08-16 VITALS — BP 118/82 | Ht 64.0 in | Wt 190.8 lb

## 2015-08-16 DIAGNOSIS — S60411A Abrasion of left index finger, initial encounter: Secondary | ICD-10-CM | POA: Diagnosis not present

## 2015-08-16 DIAGNOSIS — Z0000 Encounter for general adult medical examination without abnormal findings: Secondary | ICD-10-CM | POA: Diagnosis not present

## 2015-08-16 DIAGNOSIS — Z01419 Encounter for gynecological examination (general) (routine) without abnormal findings: Secondary | ICD-10-CM

## 2015-08-16 DIAGNOSIS — Z13220 Encounter for screening for lipoid disorders: Secondary | ICD-10-CM

## 2015-08-16 DIAGNOSIS — F338 Other recurrent depressive disorders: Secondary | ICD-10-CM

## 2015-08-16 DIAGNOSIS — R5383 Other fatigue: Secondary | ICD-10-CM | POA: Diagnosis not present

## 2015-08-16 DIAGNOSIS — Z23 Encounter for immunization: Secondary | ICD-10-CM | POA: Diagnosis not present

## 2015-08-16 DIAGNOSIS — R7301 Impaired fasting glucose: Secondary | ICD-10-CM | POA: Diagnosis not present

## 2015-08-16 DIAGNOSIS — F39 Unspecified mood [affective] disorder: Secondary | ICD-10-CM | POA: Diagnosis not present

## 2015-08-16 MED ORDER — FLUTICASONE PROPIONATE 50 MCG/ACT NA SUSP: 2.0000 | Freq: Every day | NASAL | Status: DC

## 2015-08-16 MED ORDER — BUPROPION HCL ER (XL) 150 MG PO TB24: 150.0000 mg | ORAL_TABLET | Freq: Every day | ORAL | Status: DC

## 2015-08-16 MED ORDER — HYDROCHLOROTHIAZIDE 25 MG PO TABS: 25.0000 mg | ORAL_TABLET | Freq: Every day | ORAL | Status: DC | PRN

## 2015-08-17 ENCOUNTER — Encounter: Payer: Self-pay | Admitting: Nurse Practitioner

## 2015-08-17 LAB — BASIC METABOLIC PANEL
BUN/Creatinine Ratio: 15 (ref 9–23)
BUN: 14 mg/dL (ref 6–24)
CALCIUM: 9.8 mg/dL (ref 8.7–10.2)
CHLORIDE: 95 mmol/L — AB (ref 96–106)
CO2: 24 mmol/L (ref 18–29)
Creatinine, Ser: 0.92 mg/dL (ref 0.57–1.00)
GFR calc non Af Amer: 74 mL/min/{1.73_m2} (ref >59)
GFR, EST AFRICAN AMERICAN: 86 mL/min/{1.73_m2} (ref >59)
GLUCOSE: 105 mg/dL — AB (ref 65–99)
POTASSIUM: 4.2 mmol/L (ref 3.5–5.2)
Sodium: 137 mmol/L (ref 134–144)

## 2015-08-17 LAB — LIPID PANEL
CHOL/HDL RATIO: 3.4 ratio (ref 0.0–4.4)
CHOLESTEROL TOTAL: 269 mg/dL — AB (ref 100–199)
HDL: 78 mg/dL (ref >39)
LDL CALC: 173 mg/dL — AB (ref 0–99)
TRIGLYCERIDES: 92 mg/dL (ref 0–149)
VLDL CHOLESTEROL CAL: 18 mg/dL (ref 5–40)

## 2015-08-17 LAB — TSH: TSH: 2.14 u[IU]/mL (ref 0.450–4.500)

## 2015-08-17 LAB — HEPATIC FUNCTION PANEL
ALT: 23 IU/L (ref 0–32)
AST: 18 IU/L (ref 0–40)
Albumin: 4.9 g/dL (ref 3.5–5.5)
Alkaline Phosphatase: 83 IU/L (ref 39–117)
BILIRUBIN TOTAL: 0.8 mg/dL (ref 0.0–1.2)
Bilirubin, Direct: 0.18 mg/dL (ref 0.00–0.40)
Total Protein: 7.5 g/dL (ref 6.0–8.5)

## 2015-08-17 LAB — VITAMIN D 25 HYDROXY (VIT D DEFICIENCY, FRACTURES): Vit D, 25-Hydroxy: 28.4 ng/mL — ABNORMAL LOW (ref 30.0–100.0)

## 2015-08-17 NOTE — Progress Notes (Signed)
   Subjective:    Patient ID: Ashley Mcintosh, female    DOB: [birth year], 50 y.o.   MRN: [id number]  HPI Presents for her wellness exam. Has an IUD. Occasional mild spotting. Slowly increasing her activity after recent back surgery. Plans an eye exam this year. Regular dental exams. Has started back on Wellbutrin for seasonal affective disorder.     Review of Systems  Constitutional: Negative for fever, activity change, appetite change and fatigue.  HENT: Negative for dental problem, ear pain, sinus pressure and sore throat.   Respiratory: Negative for cough, chest tightness, shortness of breath and wheezing.   Cardiovascular: Negative for chest pain.  Gastrointestinal: Negative for nausea, vomiting, abdominal pain, diarrhea, constipation and abdominal distention.  Genitourinary: Negative for dysuria, urgency, frequency, vaginal discharge, enuresis, difficulty urinating, genital sores, menstrual problem and pelvic pain.       Objective:   Physical Exam  Constitutional: She is oriented to person, place, and time. She appears well-developed. No distress.  HENT:  Right Ear: External ear normal.  Left Ear: External ear normal.  Mouth/Throat: Oropharynx is clear and moist.  Neck: Normal range of motion. Neck supple. No tracheal deviation present. No thyromegaly present.  Cardiovascular: Normal rate, regular rhythm and normal heart sounds.  Exam reveals no gallop.   No murmur heard. Pulmonary/Chest: Effort normal and breath sounds normal.  Abdominal: Soft. She exhibits no distension. There is no tenderness.  Genitourinary: Vagina normal and uterus normal. No vaginal discharge found.  External GU: no rashes or lesions. Vagina: no discharge. Cervix normal in appearance. No CMT. Bimanual exam: no tenderness or obvious masses.   Musculoskeletal: She exhibits no edema.  Lymphadenopathy:    She has no cervical adenopathy.  Neurological: She is alert and oriented to person, place, and time.    Skin: Skin is warm and dry. No rash noted.  Small superficial open abrasion base on right index finger; no sign of infection. Patient states this happened a few days ago while cooking.   Psychiatric: She has a normal mood and affect. Her behavior is normal.  Vitals reviewed. Breast exam: mild fine nodularity; no dominant masses; axillae no adenopathy.         Assessment & Plan:   Problem List Items Addressed This Visit      Endocrine   Impaired fasting glucose   Relevant Orders   Basic metabolic panel (Completed)     Other   Seasonal affective disorder (HCC) (Chronic)   Relevant Medications   buPROPion (WELLBUTRIN XL) 150 MG 24 hr tablet    Other Visit Diagnoses    Well woman exam    -  Primary    Abrasion of left index finger, initial encounter        Relevant Orders    Td vaccine greater than or equal to 7yo preservative free IM (Completed)    Screening, lipid        Relevant Orders    Lipid panel (Completed)    Other fatigue        Relevant Orders    Hepatic function panel (Completed)    TSH (Completed)    VITAMIN D 25 Hydroxy (Vit-D Deficiency, Fractures) (Completed)      Encouraged activity and weight loss. Daily calcium and vitamin D.  Return in about 1 year (around 08/15/2016) for physical.

## 2015-09-02 ENCOUNTER — Telehealth: Payer: Self-pay | Admitting: Nurse Practitioner

## 2015-09-02 NOTE — Telephone Encounter (Signed)
Pt dropped off a form to be filled out. Form in yellow folder at nurse's station.

## 2015-09-03 NOTE — Telephone Encounter (Signed)
Nurses, if you have this please forward this afternoon. Thanks.

## 2015-09-03 NOTE — Telephone Encounter (Signed)
Form filled out and signed by dr Brett Canales. Formed faxed to number on form. Form up front for pt to keep for her records. Pt notified.

## 2015-09-11 ENCOUNTER — Encounter: Payer: Self-pay | Admitting: Nurse Practitioner

## 2015-10-31 ENCOUNTER — Encounter: Payer: Self-pay | Admitting: Nurse Practitioner

## 2015-11-01 ENCOUNTER — Other Ambulatory Visit: Payer: Self-pay | Admitting: Nurse Practitioner

## 2015-11-01 MED ORDER — FLUCONAZOLE 150 MG PO TABS
ORAL_TABLET | ORAL | Status: DC
Start: 2015-11-01 — End: 2016-02-07

## 2015-11-01 MED ORDER — PHENTERMINE HCL 37.5 MG PO TABS: 37.5000 mg | ORAL_TABLET | Freq: Every day | ORAL | Status: DC

## 2015-11-05 ENCOUNTER — Telehealth: Payer: Self-pay | Admitting: Nurse Practitioner

## 2015-11-05 NOTE — Telephone Encounter (Signed)
[facility]? She restarts this in the Spring. Thanks.

## 2015-11-05 NOTE — Telephone Encounter (Signed)
[facility] [place] and they stated they do have the prescription and will fill for patient. Patient notified and stated she will go pick up rx.

## 2015-11-08 NOTE — Telephone Encounter (Signed)
Noted  

## 2015-11-20 ENCOUNTER — Other Ambulatory Visit: Payer: Self-pay | Admitting: Nurse Practitioner

## 2015-11-27 ENCOUNTER — Encounter: Payer: Self-pay | Admitting: Nurse Practitioner

## 2015-11-28 ENCOUNTER — Other Ambulatory Visit: Payer: Self-pay | Admitting: Nurse Practitioner

## 2015-11-28 MED ORDER — PHENTERMINE HCL 37.5 MG PO TABS: 37.5000 mg | ORAL_TABLET | Freq: Every day | ORAL | Status: DC

## 2015-12-04 ENCOUNTER — Other Ambulatory Visit: Payer: Self-pay | Admitting: Nurse Practitioner

## 2016-01-07 ENCOUNTER — Other Ambulatory Visit: Payer: Self-pay | Admitting: Nurse Practitioner

## 2016-01-07 ENCOUNTER — Encounter: Payer: Self-pay | Admitting: Nurse Practitioner

## 2016-01-09 ENCOUNTER — Other Ambulatory Visit: Payer: Self-pay | Admitting: Nurse Practitioner

## 2016-01-09 MED ORDER — PHENTERMINE HCL 37.5 MG PO TABS: 37.5000 mg | ORAL_TABLET | Freq: Every day | ORAL | Status: DC

## 2016-02-07 ENCOUNTER — Ambulatory Visit (INDEPENDENT_AMBULATORY_CARE_PROVIDER_SITE_OTHER): Payer: 59 | Admitting: Nurse Practitioner

## 2016-02-07 ENCOUNTER — Encounter: Payer: Self-pay | Admitting: Nurse Practitioner

## 2016-02-07 VITALS — BP 112/76 | Ht 64.0 in | Wt 175.0 lb

## 2016-02-07 DIAGNOSIS — G4762 Sleep related leg cramps: Secondary | ICD-10-CM

## 2016-02-07 MED ORDER — FLUCONAZOLE 150 MG PO TABS: ORAL_TABLET | ORAL | Status: DC

## 2016-02-07 NOTE — Progress Notes (Signed)
Subjective:  Presents for c/o left lower leg cramps only occuring at night that began about a month ago. Has a history of left low back pain but this is stable. No numbness or weakness. Due to family issues has not been exercising as much. Tried stopping her fluid pill for a few days but no change. Overall healthy well balanced diet.   Objective:   BP 112/76 mmHg  Ht 5\' 4"  (1.626 m)  Wt 175 lb (79.379 kg)  BMI 30.02 kg/m2 NAD. Alert, oriented. Lungs clear. Heart RRR. Strong left DP pulse. Toes warm with normal cap refill. SLR neg with normal ROM left hip. No pain or masses with palpation of left calf.   Assessment: Nocturnal leg cramps - Plan: Basic metabolic panel, Magnesium  Plan:  Meds ordered this encounter  Medications  . fluconazole (DIFLUCAN) 150 MG tablet    Sig: One po qd prn yeast infection; may repeat in 3-4 days if needed    Dispense:  2 tablet    Refill:  0    Order Specific Question:  Supervising Provider    Answer:  Merlyn AlbertLUKING, WILLIAM S [[id number]]   Possible overuse injury related to trying to resume activity. Labs pending. If normal, consider trial of restarting Skelaxin at bedtime.

## 2016-02-07 NOTE — Patient Instructions (Signed)
Leg Cramps Leg cramps occur when a muscle or muscles tighten and you have no control over this tightening (involuntary muscle contraction). Muscle cramps can develop in any muscle, but the most common place is in the calf muscles of the leg. Those cramps can occur during exercise or when you are at rest. Leg cramps are painful, and they may last for a few seconds to a few minutes. Cramps may return several times before they finally stop. Usually, leg cramps are not caused by a serious medical problem. In many cases, the cause is not known. Some common causes include:  Overexertion.  Overuse from repetitive motions, or doing the same thing over and over.  Remaining in a certain position for a long period of time.  Improper preparation, form, or technique while performing a sport or an activity.  Dehydration.  Injury.  Side effects of some medicines.  Abnormally low levels of the salts and ions in your blood (electrolytes), especially potassium and calcium. These levels could be low if you are taking water pills (diuretics) or if you are pregnant. HOME CARE INSTRUCTIONS Watch your condition for any changes. Taking the following actions may help to lessen any discomfort that you are feeling:  Stay well-hydrated. Drink enough fluid to keep your urine clear or pale yellow.  Try massaging, stretching, and relaxing the affected muscle. Do this for several minutes at a time.  For tight or tense muscles, use a warm towel, heating pad, or hot shower water directed to the affected area.  If you are sore or have pain after a cramp, applying ice to the affected area may relieve discomfort.  Put ice in a plastic bag.  Place a towel between your skin and the bag.  Leave the ice on for 20 minutes, 2-3 times per day.  Avoid strenuous exercise for several days if you have been having frequent leg cramps.  Make sure that your diet includes the essential minerals for your muscles to work  normally.  Take medicines only as directed by your health care provider. SEEK MEDICAL CARE IF:  Your leg cramps get more severe or more frequent, or they do not improve over time.  Your foot becomes cold, numb, or blue.   This information is not intended to replace advice given to you by your health care provider. Make sure you discuss any questions you have with your health care provider.   Document Released: 09/03/2004 Document Revised: 12/11/2014 Document Reviewed: 07/04/2014 Elsevier Interactive Patient Education 2016 [employer] Inc[employer]   

## 2016-02-08 LAB — BASIC METABOLIC PANEL
BUN / CREAT RATIO: 16 (ref 9–23)
BUN: 12 mg/dL (ref 6–24)
CHLORIDE: 96 mmol/L (ref 96–106)
CO2: 23 mmol/L (ref 18–29)
CREATININE: 0.76 mg/dL (ref 0.57–1.00)
Calcium: 9.5 mg/dL (ref 8.7–10.2)
GFR calc non Af Amer: 94 mL/min/{1.73_m2} (ref >59)
GFR, EST AFRICAN AMERICAN: 108 mL/min/{1.73_m2} (ref >59)
Glucose: 110 mg/dL — ABNORMAL HIGH (ref 65–99)
Potassium: 4.4 mmol/L (ref 3.5–5.2)
SODIUM: 137 mmol/L (ref 134–144)

## 2016-02-08 LAB — MAGNESIUM: MAGNESIUM: 2.3 mg/dL (ref 1.6–2.3)

## 2016-02-13 ENCOUNTER — Encounter: Payer: Self-pay | Admitting: Nurse Practitioner

## 2016-02-19 ENCOUNTER — Other Ambulatory Visit: Payer: Self-pay | Admitting: Nurse Practitioner

## 2016-02-19 MED ORDER — METAXALONE 800 MG PO TABS: 800.0000 mg | ORAL_TABLET | Freq: Three times a day (TID) | ORAL | Status: DC

## 2016-02-19 MED ORDER — FLUCONAZOLE 150 MG PO TABS: ORAL_TABLET | ORAL | Status: DC

## 2016-02-19 MED ORDER — CEFDINIR 300 MG PO CAPS: 300.0000 mg | ORAL_CAPSULE | Freq: Two times a day (BID) | ORAL | Status: DC

## 2016-02-19 MED ORDER — PREDNISONE 20 MG PO TABS: ORAL_TABLET | ORAL | Status: DC

## 2016-03-04 ENCOUNTER — Encounter: Payer: Self-pay | Admitting: Nurse Practitioner

## 2016-03-06 ENCOUNTER — Encounter: Payer: Self-pay | Admitting: Nurse Practitioner

## 2016-03-07 ENCOUNTER — Other Ambulatory Visit: Payer: Self-pay | Admitting: Nurse Practitioner

## 2016-03-07 MED ORDER — CLARITHROMYCIN 500 MG PO TABS: 500.0000 mg | ORAL_TABLET | Freq: Two times a day (BID) | ORAL | 0 refills | Status: DC

## 2016-03-19 ENCOUNTER — Telehealth: Payer: Self-pay | Admitting: Nurse Practitioner

## 2016-03-19 NOTE — Telephone Encounter (Signed)
Discussed with pt. Offered appt today with dr Lorin Picketscott. Pt declined. Wanted appt tomorrow with carolyn. Transferred to front to schedule office visit

## 2016-03-19 NOTE — Telephone Encounter (Signed)
Pt called stating that she has finished taking the antibiotics that carolyn prescribed but is not any better and that the sore throat is back. Please advise.      [facility]

## 2016-03-19 NOTE — Telephone Encounter (Signed)
Recommend office visit

## 2016-03-19 NOTE — Telephone Encounter (Signed)
Spoke with patient and patient has c/o sore throat, cough, and sinus congestion. Denies fever. Patient stated that she has completed course of antibiotics last prescribed. Please advise?

## 2016-03-20 ENCOUNTER — Ambulatory Visit (INDEPENDENT_AMBULATORY_CARE_PROVIDER_SITE_OTHER): Payer: 59 | Admitting: Nurse Practitioner

## 2016-03-20 ENCOUNTER — Encounter: Payer: Self-pay | Admitting: Nurse Practitioner

## 2016-03-20 VITALS — BP 122/78 | Temp 98.1°F | Ht 64.0 in | Wt 178.0 lb

## 2016-03-20 DIAGNOSIS — J300 Vasomotor rhinitis: Secondary | ICD-10-CM

## 2016-03-20 MED ORDER — METHYLPREDNISOLONE ACETATE 40 MG/ML IJ SUSP
40.0000 mg | Freq: Once | INTRAMUSCULAR | Status: AC
  Administered 2016-03-20: 40 mg via INTRAMUSCULAR

## 2016-03-20 MED ORDER — AMOXICILLIN-POT CLAVULANATE 875-125 MG PO TABS: 1.0000 | ORAL_TABLET | Freq: Two times a day (BID) | ORAL | 0 refills | Status: DC

## 2016-03-20 NOTE — Progress Notes (Signed)
Subjective:  Presents for recheck of sinus congestion. Has finished a course of Omnicef and Biaxin which helped while she was on them but symptoms came back when complete. Frontal area pressure at times. Sore throat x 2 d. Increased cough at night. No fever. No ear pain or wheezing. No vomiting, reflux or abd pain.   Objective:   BP 122/78   Temp 98.1 F (36.7 C) (Oral)   Ht 5\' 4"  (1.626 m)   Wt 178 lb (80.7 kg)   BMI 30.55 kg/m  NAD. Alert, oriented. TMs clear effusion. Pharynx mildly injected with clear PND noted. Neck supple with mild anterior adenopathy. Lungs clear. Heart RRR. Occasional non productive cough noted. Abdomen soft, non tender.   Assessment: Vasomotor rhinitis - Plan: methylPREDNISolone acetate (DEPO-MEDROL) injection 40 mg  Plan:  Meds ordered this encounter  Medications  . methylPREDNISolone acetate (DEPO-MEDROL) injection 40 mg  . amoxicillin-clavulanate (AUGMENTIN) 875-125 MG tablet    Sig: Take 1 tablet by mouth 2 (two) times daily.    Dispense:  20 tablet    Refill:  0    Order Specific Question:   Supervising Provider    Answer:   Merlyn AlbertLUKING, WILLIAM S [[id number]]  restart daily flonase. Call back next week if no better. OTC antihistamine as directed. Given Rx for Augmentin in case symptoms worsen over the weekend. Has taken this without difficulty in 2011.

## 2016-03-20 NOTE — Patient Instructions (Signed)
Xyzal as directed. 

## 2016-04-16 IMAGING — CT CT L SPINE W/ CM
4 of 10 series · 11 of 33 positions shown, 13 images · non-contrast
Comparison: Outside MRI lumbar 04/09/2014.  MR lumbar 07/12/2013.

CLINICAL DATA: LEFT leg radicular symptoms have re-occurred
following LEFT L5-S1 discectomy. Subsequent encounter.
TECHNIQUE: Contiguous axial images were obtained through the Lumbar spine after
the intrathecal infusion of infusion. Coronal and sagittal
reconstructions were obtained of the axial image sets.

[Series 2: l spine bone · axial · 0.27mm/px · z∈[-188,-111]mm · 2 of 93 slices shown, 3 images]
[im 31/93  soft-tissue]
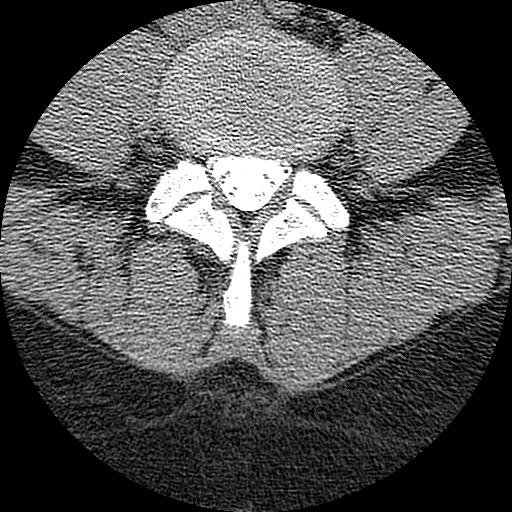
[im 31/93  bone]
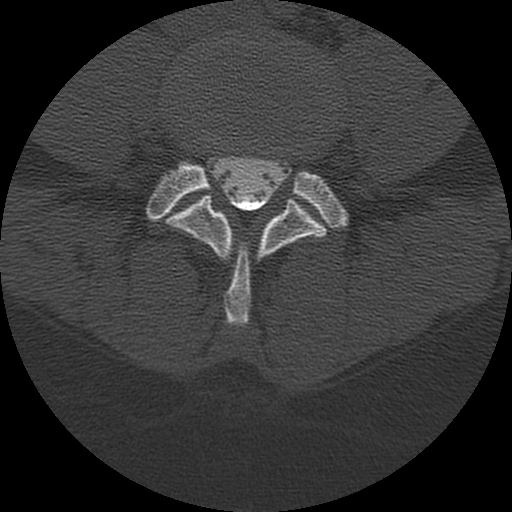
[im 62/93  bone]
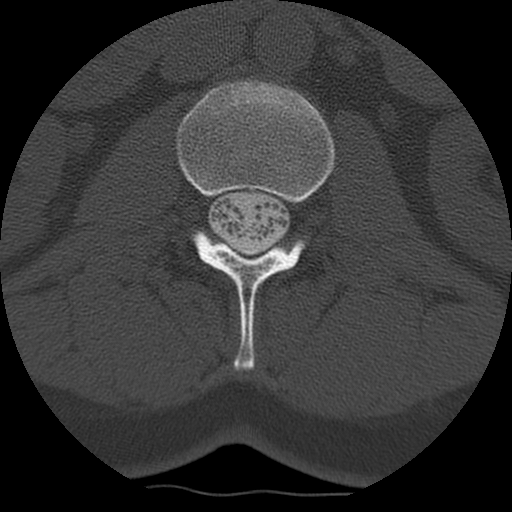

[Series 3: l spine soft · axial · 0.27mm/px · z∈[-206,-91]mm · 3 of 93 slices shown]
[im 24/93  soft-tissue]
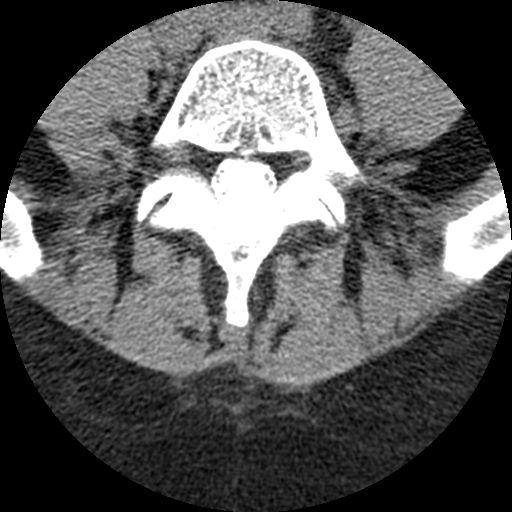
[im 47/93  soft-tissue]
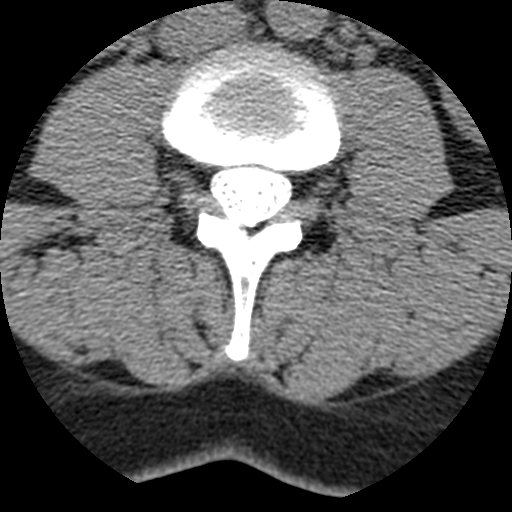
[im 70/93  soft-tissue]
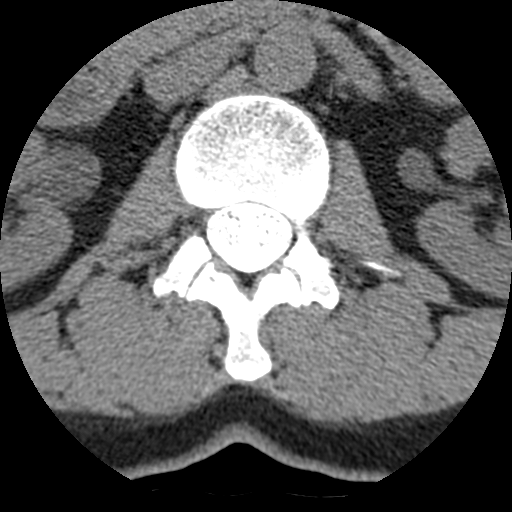

[Series 104: cor/lower · coronal · 0.27mm/px · 1 of 50 slices shown]
[im 25/50  bone]
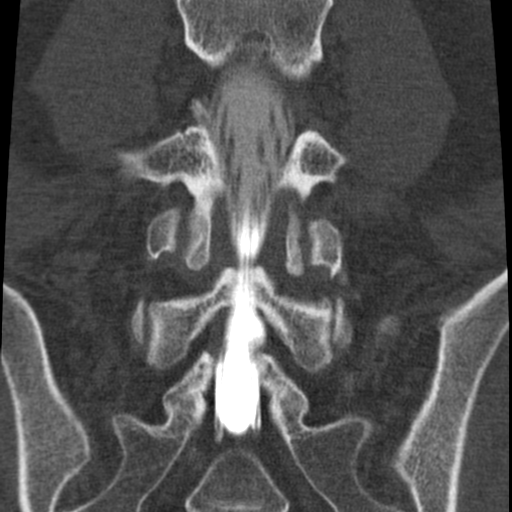

[Series 105: sag · sagittal · 0.46mm/px · 5 of 55 slices shown, 6 images]
[im 19/55  bone]
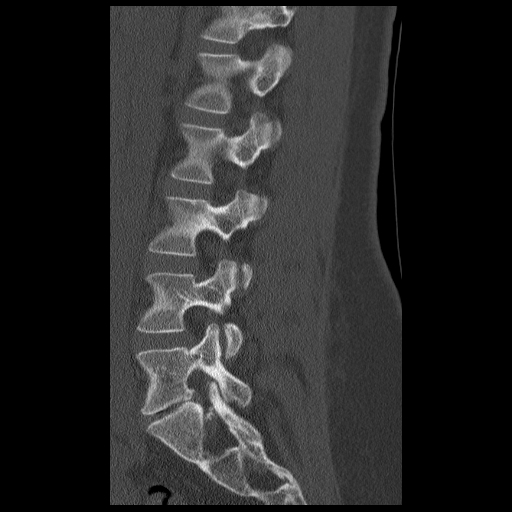
[im 23/55  bone]
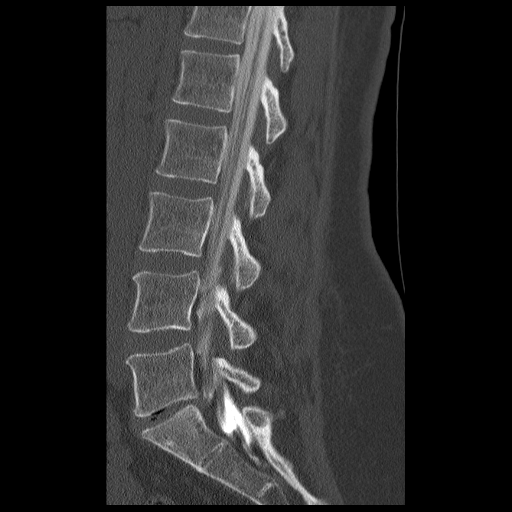
[im 28/55  soft-tissue]
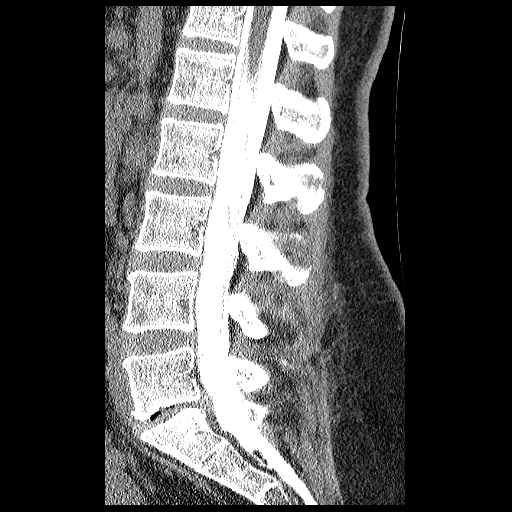
[im 28/55  bone]
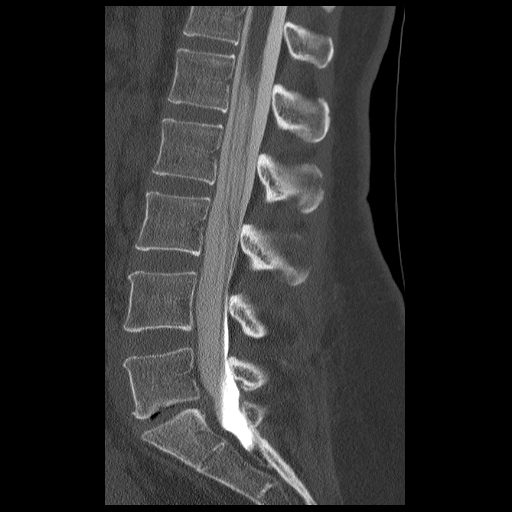
[im 32/55  bone]
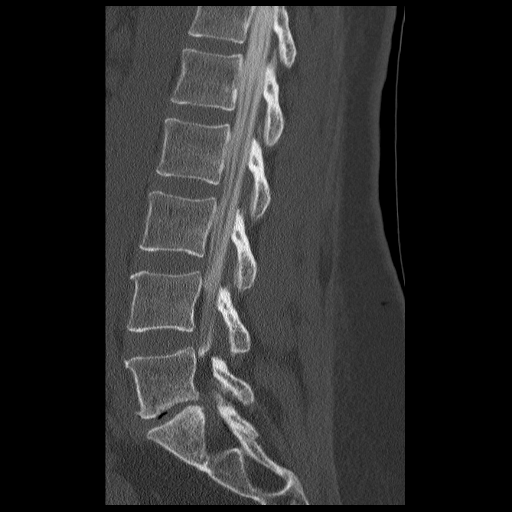
[im 37/55  bone]
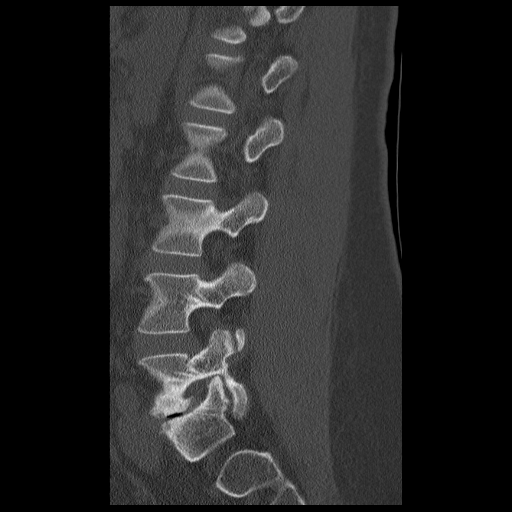

[11 of 33 positions shown; findings below may reference images not displayed]

EXAM:
LUMBAR MYELOGRAM

FLUOROSCOPY TIME:  42 seconds

PROCEDURE:
After thorough discussion of risks and benefits of the procedure
including bleeding, infection, injury to nerves, blood vessels,
adjacent structures as well as headache and CSF leak, written and
oral informed consent was obtained. Consent was obtained by Dr. Ceejay
Stefen. Time out form was completed.

Patient was positioned prone on the fluoroscopy table. Local
anesthesia was provided with 1% lidocaine without epinephrine after
prepped and draped in the usual sterile fashion. Puncture was
performed at L3-4 using a 3 1/2 inch 22-gauge spinal needle via
midline approach. Using a single pass through the dura, the needle
was placed within the thecal sac, with return of clear CSF. 15 mL of
3mnipaque-UQQ was injected into the thecal sac, with normal
opacification of the nerve roots and cauda equina consistent with
free flow within the subarachnoid space.

I personally performed the lumbar puncture and administered the
intrathecal contrast. I also personally supervised acquisition of
the myelogram images.
FINDINGS: LUMBAR MYELOGRAM FINDINGS:

Good opacification lumbar subarachnoid space. Advanced disc space
narrowing at L5-S1. Endplate sclerosis. Underfilling of the LEFT S1
nerve root. No spinal stenosis.

With patient standing, the alignment remains anatomic except for
trace retrolisthesis L5-S1. There is no dynamic instability.

CT LUMBAR MYELOGRAM FINDINGS:

Segmentation: Normal.

Alignment: Normal except for trace retrolisthesis L5-S1. No pars
defects.

Vertebrae: No worrisome osseous lesions. Endplate reactive changes
are developing at L5-S1 with sclerosis.

Conus medullaris: Normal.

Paraspinal tissues: Normal.

Disc levels:

L1-L2:  Normal.

L2-L3:  Normal.

L3-L4:  Normal.

L4-L5:  Normal.

L5-S1: Recurrent central and leftward protrusion. Osteophytic
spurring associated with disc space narrowing and vacuum phenomenon.
The LEFT S1 nerve root is dorsally displaced and does not
appreciably fill with contrast. This appears to be due to mass
effect in not fibrosis, given the displacement of the adjacent
thecal sac (image 74 series 3). Mild superimposed facet arthropathy
is noted.
IMPRESSION: LUMBAR MYELOGRAM IMPRESSION:

Underfilling of the LEFT S1 nerve root.  No dynamic instability.

CT LUMBAR MYELOGRAM IMPRESSION:

Recurrent central and leftward protrusion at L5-S1. Mass effect on
the LEFT S1 nerve root and thecal sac. Progressive endplate reactive
changes at L5-S1 due to disc space narrowing.

## 2016-05-21 ENCOUNTER — Other Ambulatory Visit: Payer: Self-pay | Admitting: Nurse Practitioner

## 2016-05-21 DIAGNOSIS — Z1231 Encounter for screening mammogram for malignant neoplasm of breast: Secondary | ICD-10-CM

## 2016-06-05 ENCOUNTER — Ambulatory Visit
Admission: RE | Admit: 2016-06-05 | Discharge: 2016-06-05 | Disposition: A | Discharge status: Home / Self Care | Payer: 59 | Source: Ambulatory Visit | Attending: Nurse Practitioner | Admitting: Nurse Practitioner

## 2016-06-05 DIAGNOSIS — Z1231 Encounter for screening mammogram for malignant neoplasm of breast: Secondary | ICD-10-CM

## 2016-07-22 ENCOUNTER — Ambulatory Visit (INDEPENDENT_AMBULATORY_CARE_PROVIDER_SITE_OTHER): Payer: 59 | Admitting: Nurse Practitioner

## 2016-07-22 ENCOUNTER — Encounter: Payer: Self-pay | Admitting: Nurse Practitioner

## 2016-07-22 VITALS — BP 118/88 | Temp 98.3°F | Ht 64.0 in | Wt 189.0 lb

## 2016-07-22 DIAGNOSIS — J0100 Acute maxillary sinusitis, unspecified: Secondary | ICD-10-CM | POA: Diagnosis not present

## 2016-07-22 MED ORDER — AMOXICILLIN-POT CLAVULANATE 875-125 MG PO TABS: 1.0000 | ORAL_TABLET | Freq: Two times a day (BID) | ORAL | 0 refills | Status: DC

## 2016-07-22 MED ORDER — METHYLPREDNISOLONE ACETATE 40 MG/ML IJ SUSP
40.0000 mg | Freq: Once | INTRAMUSCULAR | Status: AC
  Administered 2016-07-22: 40 mg via INTRAMUSCULAR

## 2016-07-22 NOTE — Progress Notes (Signed)
Subjective:  Presents complaints of sinus symptoms for the past 5 days. Now having facial area pains especially in the cheeks radiating into the teeth. Cough producing yellow sputum. No wheezing. No ear pain or sore throat. Cough is actually improved slightly.  Objective:   BP 118/88   Temp 98.3 F (36.8 C) (Oral)   Ht 5\' 4"  (1.626 m)   Wt 189 lb (85.7 kg)   BMI 32.44 kg/m  NAD. Alert, oriented. TMs significant clear effusion bilateral, no erythema. Pharynx nonerythematous with slightly yellow PND noted. Neck supple with mild soft anterior adenopathy. Lungs clear. Heart regular rhythm.  Assessment: Acute non-recurrent maxillary sinusitis - Plan: methylPREDNISolone acetate (DEPO-MEDROL) injection 40 mg  Plan:  Meds ordered this encounter  Medications  . buPROPion (WELLBUTRIN XL) 150 MG 24 hr tablet    Sig: Take 1 tablet by mouth daily.  Marland Kitchen. DISCONTD: amoxicillin-clavulanate (AUGMENTIN) 875-125 MG tablet    Sig: Take 1 tablet by mouth 2 (two) times daily.    Dispense:  20 tablet    Refill:  0    Order Specific Question:   Supervising Provider    Answer:   Merlyn AlbertLUKING, WILLIAM S [[id number]]  . methylPREDNISolone acetate (DEPO-MEDROL) injection 40 mg  . amoxicillin-clavulanate (AUGMENTIN) 875-125 MG tablet    Sig: Take 1 tablet by mouth 2 (two) times daily.    Dispense:  20 tablet    Refill:  0   Continue OTC meds as directed. Warning signs reviewed. Call back if worsens or persists.

## 2016-08-07 ENCOUNTER — Other Ambulatory Visit: Payer: Self-pay | Admitting: Nurse Practitioner

## 2016-08-07 ENCOUNTER — Encounter: Payer: Self-pay | Admitting: Nurse Practitioner

## 2016-08-07 MED ORDER — FLUCONAZOLE 150 MG PO TABS: ORAL_TABLET | ORAL | 0 refills | Status: DC

## 2016-08-10 ENCOUNTER — Encounter: Payer: Self-pay | Admitting: Nurse Practitioner

## 2016-08-11 MED ORDER — DOXYCYCLINE HYCLATE 100 MG PO TABS: 100.0000 mg | ORAL_TABLET | Freq: Two times a day (BID) | ORAL | 0 refills | Status: DC

## 2016-08-25 ENCOUNTER — Encounter: Payer: Self-pay | Admitting: Family Medicine

## 2016-08-25 ENCOUNTER — Telehealth: Payer: Self-pay | Admitting: Family Medicine

## 2016-08-25 ENCOUNTER — Ambulatory Visit (INDEPENDENT_AMBULATORY_CARE_PROVIDER_SITE_OTHER): Payer: 59 | Admitting: Family Medicine

## 2016-08-25 VITALS — BP 118/74 | Temp 100.3°F | Ht 64.0 in | Wt 193.0 lb

## 2016-08-25 DIAGNOSIS — J111 Influenza due to unidentified influenza virus with other respiratory manifestations: Secondary | ICD-10-CM

## 2016-08-25 MED ORDER — HYDROCODONE-HOMATROPINE 5-1.5 MG/5ML PO SYRP: ORAL_SOLUTION | ORAL | 0 refills | Status: DC

## 2016-08-25 MED ORDER — ALBUTEROL SULFATE HFA 108 (90 BASE) MCG/ACT IN AERS: 2.0000 | INHALATION_SPRAY | Freq: Four times a day (QID) | RESPIRATORY_TRACT | 0 refills | Status: DC | PRN

## 2016-08-25 MED ORDER — OSELTAMIVIR PHOSPHATE 75 MG PO CAPS: 75.0000 mg | ORAL_CAPSULE | Freq: Two times a day (BID) | ORAL | 0 refills | Status: DC

## 2016-08-25 NOTE — Progress Notes (Signed)
   Subjective:    Patient ID: Ashley SealsSharon H Mcintosh, female    DOB: [birth year], 51 y.o.   MRN: [id number]  Sinusitis  This is a new problem. Episode onset: 5 days. Associated symptoms include coughing and headaches. Treatments tried: delsym, nyquil.   Further history rather sudden onset of headache and achiness in the last 24 hours. Accompanied by fever. Also coming by loss of energy and appetite.  Patient also gives history persistent cough since last illness. See prior note.   Review of Systems  Respiratory: Positive for cough.   Neurological: Positive for headaches.       Objective:   Physical Exam Alert positive fever diminished energy positive malaise HET moderate his congestion lungs wheezy cough during exam heart regular in rhythm.       Assessment & Plan:  Impression 1 acute influenza #2 chronic cough post. Influenza-like illness over a month ago plan Tamiflu twice a day 5 days. Symptom care discussed. Albuterol 2 sprays 4 times a day when necessary for persistent cough. Expect slow resolution with this one to punch WSL

## 2016-08-25 NOTE — Telephone Encounter (Signed)
Patient scheduled office visit for recheck today with Dr Brett CanalesSteve.

## 2016-08-25 NOTE — Telephone Encounter (Signed)
Patient saw Eber JonesCarolyn on 07/22/16 and prescribed amoxicillin-clavulanate (AUGMENTIN) 875-125 for sinus and cough.  Called back on 08-10-16 b/c still sick and was prescribed doxycycline (VIBRA-TABS) 100 MG . Each time patient finishes all meds and then within a few days all symptoms start coming back. Still has some sinus but the cough is back and getting worse. Deep cough.  Wants to know if she should get another round of antibiotics or come in?  [facility] [place] and can be reached at [phone].

## 2016-08-25 NOTE — Telephone Encounter (Signed)
rec o v later tod with me

## 2016-09-09 ENCOUNTER — Encounter: Payer: Self-pay | Admitting: Family Medicine

## 2016-09-09 MED ORDER — BENZONATATE 100 MG PO CAPS: 100.0000 mg | ORAL_CAPSULE | Freq: Four times a day (QID) | ORAL | 0 refills | Status: DC | PRN

## 2016-11-02 ENCOUNTER — Other Ambulatory Visit: Payer: Self-pay | Admitting: Family Medicine

## 2016-11-02 DIAGNOSIS — L700 Acne vulgaris: Secondary | ICD-10-CM | POA: Diagnosis not present

## 2016-11-02 DIAGNOSIS — L821 Other seborrheic keratosis: Secondary | ICD-10-CM | POA: Diagnosis not present

## 2016-11-13 DIAGNOSIS — L700 Acne vulgaris: Secondary | ICD-10-CM | POA: Diagnosis not present

## 2016-11-17 ENCOUNTER — Telehealth: Payer: Self-pay | Admitting: Family Medicine

## 2016-11-17 NOTE — Telephone Encounter (Signed)
(  Message for Ashley Mcintosh) patient has physical 5/11 and needing labs done.

## 2016-11-21 ENCOUNTER — Other Ambulatory Visit: Payer: Self-pay | Admitting: Nurse Practitioner

## 2016-11-23 ENCOUNTER — Encounter: Payer: Self-pay | Admitting: Nurse Practitioner

## 2016-11-23 ENCOUNTER — Other Ambulatory Visit: Payer: Self-pay | Admitting: Nurse Practitioner

## 2016-11-23 DIAGNOSIS — R7301 Impaired fasting glucose: Secondary | ICD-10-CM

## 2016-11-23 DIAGNOSIS — E785 Hyperlipidemia, unspecified: Secondary | ICD-10-CM

## 2016-11-23 MED ORDER — PHENTERMINE HCL 37.5 MG PO TABS: 37.5000 mg | ORAL_TABLET | Freq: Every day | ORAL | 2 refills | Status: DC

## 2016-11-23 NOTE — Telephone Encounter (Signed)
Left message return call 11/23/16 

## 2016-11-23 NOTE — Telephone Encounter (Signed)
Spoke with patient and informed her per Nathaneil Canary- labs have been ordered. Patient verbalized understanding.

## 2016-11-23 NOTE — Telephone Encounter (Signed)
Labs ordered.

## 2016-12-11 DIAGNOSIS — E785 Hyperlipidemia, unspecified: Secondary | ICD-10-CM | POA: Diagnosis not present

## 2016-12-11 DIAGNOSIS — R7301 Impaired fasting glucose: Secondary | ICD-10-CM | POA: Diagnosis not present

## 2016-12-12 LAB — HEPATIC FUNCTION PANEL
ALBUMIN: 4.3 g/dL (ref 3.5–5.5)
ALT: 14 IU/L (ref 0–32)
AST: 16 IU/L (ref 0–40)
Alkaline Phosphatase: 57 IU/L (ref 39–117)
Bilirubin Total: 0.5 mg/dL (ref 0.0–1.2)
Bilirubin, Direct: 0.13 mg/dL (ref 0.00–0.40)
Total Protein: 6.3 g/dL (ref 6.0–8.5)

## 2016-12-12 LAB — HEMOGLOBIN A1C
ESTIMATED AVERAGE GLUCOSE: 100 mg/dL
Hgb A1c MFr Bld: 5.1 % (ref 4.8–5.6)

## 2016-12-12 LAB — LIPID PANEL
Chol/HDL Ratio: 3.2 ratio (ref 0.0–4.4)
Cholesterol, Total: 170 mg/dL (ref 100–199)
HDL: 53 mg/dL (ref >39)
LDL CALC: 103 mg/dL — AB (ref 0–99)
Triglycerides: 70 mg/dL (ref 0–149)
VLDL CHOLESTEROL CAL: 14 mg/dL (ref 5–40)

## 2016-12-18 ENCOUNTER — Encounter: Payer: Self-pay | Admitting: Nurse Practitioner

## 2016-12-18 ENCOUNTER — Ambulatory Visit (INDEPENDENT_AMBULATORY_CARE_PROVIDER_SITE_OTHER): Payer: 59 | Admitting: Nurse Practitioner

## 2016-12-18 VITALS — BP 118/74 | Ht 64.0 in | Wt 192.0 lb

## 2016-12-18 DIAGNOSIS — Z124 Encounter for screening for malignant neoplasm of cervix: Secondary | ICD-10-CM

## 2016-12-18 DIAGNOSIS — Z0000 Encounter for general adult medical examination without abnormal findings: Secondary | ICD-10-CM | POA: Diagnosis not present

## 2016-12-18 DIAGNOSIS — Z01419 Encounter for gynecological examination (general) (routine) without abnormal findings: Secondary | ICD-10-CM

## 2016-12-18 DIAGNOSIS — Z1151 Encounter for screening for human papillomavirus (HPV): Secondary | ICD-10-CM

## 2016-12-18 NOTE — Progress Notes (Signed)
Subjective:    Patient ID: Ashley Mcintosh, female    DOB: 1966-05-22, 51 y.o.   MRN: [id number]  HPI presents for her wellness exam. Has Mirena for birth control. Spots 2-3 times per year. No pelvic pain. Same sexual partner. Regular activity. Regular vision and dental exams. Only has leg cramps when she takes HCTZ which is rare. No added salt to her diet.     Review of Systems  Constitutional: Negative for activity change, appetite change and fatigue.  HENT: Negative for dental problem, ear pain, sinus pressure and sore throat.   Respiratory: Negative for cough, chest tightness, shortness of breath and wheezing.   Cardiovascular: Negative for chest pain.  Gastrointestinal: Negative for abdominal distention, abdominal pain, blood in stool, constipation, diarrhea, nausea and vomiting.  Genitourinary: Negative for difficulty urinating, dysuria, enuresis, frequency, genital sores, menstrual problem, pelvic pain, urgency and vaginal discharge.       Objective:   Physical Exam  Constitutional: She is oriented to person, place, and time. She appears well-developed. No distress.  HENT:  Right Ear: External ear normal.  Left Ear: External ear normal.  Mouth/Throat: Oropharynx is clear and moist.  Neck: Normal range of motion. Neck supple. No tracheal deviation present. No thyromegaly present.  Cardiovascular: Normal rate, regular rhythm and normal heart sounds.  Exam reveals no gallop.   No murmur heard. Pulmonary/Chest: Effort normal and breath sounds normal.  Abdominal: Soft. She exhibits no distension. There is no tenderness.  Genitourinary: Vagina normal and uterus normal. No vaginal discharge found.  Genitourinary Comments: External GU: no rashes or lesions. Vagina: no discharge. Cervix normal in appearance. No CMT. Bimanual exam: no tenderness or obvious masses.   Musculoskeletal: She exhibits no edema.  Lymphadenopathy:    She has no cervical adenopathy.  Neurological: She is  alert and oriented to person, place, and time.  Skin: Skin is warm and dry. No rash noted.  Psychiatric: She has a normal mood and affect. Her behavior is normal.  Vitals reviewed. breast exam: areas of dense tissue; no masses; axillae no adenopathy.  Reviewed recent labs. Results for orders placed or performed in visit on 11/23/16  Lipid panel  Result Value Ref Range   Cholesterol, Total 170 100 - 199 mg/dL   Triglycerides 70 0 - 149 mg/dL   HDL 53 >40 mg/dL   VLDL Cholesterol Cal 14 5 - 40 mg/dL   LDL Calculated 981 (H) 0 - 99 mg/dL   Chol/HDL Ratio 3.2 0.0 - 4.4 ratio  Hepatic function panel  Result Value Ref Range   Total Protein 6.3 6.0 - 8.5 g/dL   Albumin 4.3 3.5 - 5.5 g/dL   Bilirubin Total 0.5 0.0 - 1.2 mg/dL   Bilirubin, Direct 1.91 0.00 - 0.40 mg/dL   Alkaline Phosphatase 57 39 - 117 IU/L   AST 16 0 - 40 IU/L   ALT 14 0 - 32 IU/L  Hemoglobin A1c  Result Value Ref Range   Hgb A1c MFr Bld 5.1 4.8 - 5.6 %   Est. average glucose Bld gHb Est-mCnc 100 mg/dL          Assessment & Plan:  Routine general medical examination at a health care facility  Well woman exam - Plan: Pap IG and HPV (high risk) DNA detection  Screening for HPV (human papillomavirus) - Plan: Pap IG and HPV (high risk) DNA detection  Screening for cervical cancer - Plan: Pap IG and HPV (high risk) DNA detection  Continue activity and  weight loss efforts. Also continue daily MVI. Return in about 1 year (around 12/18/2017) for physical.]

## 2016-12-18 NOTE — Patient Instructions (Signed)
Zaditor eye drops

## 2016-12-23 LAB — PAP IG AND HPV HIGH-RISK
HPV, HIGH-RISK: NEGATIVE
PAP Smear Comment: 0

## 2017-01-07 ENCOUNTER — Other Ambulatory Visit: Payer: Self-pay | Admitting: Nurse Practitioner

## 2017-01-07 ENCOUNTER — Encounter: Payer: Self-pay | Admitting: Nurse Practitioner

## 2017-01-07 MED ORDER — FLUCONAZOLE 150 MG PO TABS: ORAL_TABLET | ORAL | 0 refills | Status: DC

## 2017-02-12 ENCOUNTER — Other Ambulatory Visit: Payer: Self-pay | Admitting: Nurse Practitioner

## 2017-03-18 ENCOUNTER — Encounter: Payer: Self-pay | Admitting: Nurse Practitioner

## 2017-04-27 ENCOUNTER — Other Ambulatory Visit: Payer: Self-pay | Admitting: Nurse Practitioner

## 2017-04-27 ENCOUNTER — Encounter: Payer: Self-pay | Admitting: Nurse Practitioner

## 2017-04-27 MED ORDER — NALTREXONE-BUPROPION HCL ER 8-90 MG PO TB12: ORAL_TABLET | ORAL | 2 refills | Status: DC

## 2017-04-28 ENCOUNTER — Telehealth: Payer: Self-pay | Admitting: *Deleted

## 2017-04-28 ENCOUNTER — Other Ambulatory Visit: Payer: Self-pay | Admitting: Nurse Practitioner

## 2017-04-28 ENCOUNTER — Encounter: Payer: Self-pay | Admitting: Nurse Practitioner

## 2017-04-28 MED ORDER — NALTREXONE-BUPROPION HCL ER 8-90 MG PO TB12: ORAL_TABLET | ORAL | 2 refills | Status: DC

## 2017-04-28 NOTE — Telephone Encounter (Signed)
Fax received from pharmacy stating that insurance will not cover Contrave. Ashley Mcintosh notified and and advised to call patient and notify her and see if she would like to do the plain wellbutrin

## 2017-04-28 NOTE — Telephone Encounter (Signed)
Patient advised fax received from pharmacy stating that insurance will not cover Contrave. Eber Jones notified and and advised to call patient and notify her and see if she would like to do the plain Wellbutrin. Patient verbalized understanding and stated she will look on line to see what kind of coupons are available for self pay and call back if she is unable to get and wants the plain Wellbutrin sent in.

## 2017-07-19 ENCOUNTER — Other Ambulatory Visit: Payer: Self-pay | Admitting: Nurse Practitioner

## 2017-10-29 ENCOUNTER — Other Ambulatory Visit: Payer: Self-pay | Admitting: Nurse Practitioner

## 2017-10-29 ENCOUNTER — Encounter: Payer: Self-pay | Admitting: Nurse Practitioner

## 2017-10-29 MED ORDER — PHENTERMINE HCL 37.5 MG PO TABS: 37.5000 mg | ORAL_TABLET | Freq: Every day | ORAL | 2 refills | Status: DC

## 2017-11-17 ENCOUNTER — Encounter: Payer: Self-pay | Admitting: Family Medicine

## 2017-11-18 NOTE — Telephone Encounter (Signed)
Left message to return call 

## 2017-11-18 NOTE — Telephone Encounter (Signed)
Pt called back and stated that she would get Ryan to call back for allergy appt.

## 2017-12-22 ENCOUNTER — Other Ambulatory Visit (HOSPITAL_COMMUNITY)
Admission: AD | Admit: 2017-12-22 | Discharge: 2017-12-22 | Disposition: A | Discharge status: Home / Self Care | Payer: 59 | Source: Other Acute Inpatient Hospital | Attending: Family Medicine | Admitting: Family Medicine

## 2017-12-22 ENCOUNTER — Encounter: Payer: Self-pay | Admitting: Family Medicine

## 2017-12-22 ENCOUNTER — Ambulatory Visit: Payer: 59 | Admitting: Family Medicine

## 2017-12-22 VITALS — BP 124/88 | Temp 98.4°F | Ht 64.0 in | Wt 203.2 lb

## 2017-12-22 DIAGNOSIS — N3000 Acute cystitis without hematuria: Secondary | ICD-10-CM

## 2017-12-22 DIAGNOSIS — R300 Dysuria: Secondary | ICD-10-CM | POA: Diagnosis not present

## 2017-12-22 LAB — POCT URINALYSIS DIPSTICK: PH UA: 5 (ref 5.0–8.0)

## 2017-12-22 MED ORDER — CEFPROZIL 500 MG PO TABS: 500.0000 mg | ORAL_TABLET | Freq: Two times a day (BID) | ORAL | 0 refills | Status: DC

## 2017-12-22 MED ORDER — FLUCONAZOLE 150 MG PO TABS: ORAL_TABLET | ORAL | 3 refills | Status: DC

## 2017-12-22 NOTE — Progress Notes (Signed)
   Subjective:    Patient ID: Ashley Mcintosh, female    DOB: 03/27/1966, 52 y.o.   MRN: [id number]  Urinary Tract Infection   This is a new problem. The current episode started in the past 7 days. Associated symptoms include frequency and urgency. She has tried increased fluids (AZO, cranberry juice) for the symptoms. The treatment provided mild relief.  Pt will need script for Diflucan sent in if ABT is prescribed. Results for orders placed or performed in visit on 12/22/17  POCT Urinalysis Dipstick  Result Value Ref Range   Color, UA     Clarity, UA     Glucose, UA     Bilirubin, UA     Ketones, UA     Spec Grav, UA  1.010 - 1.025   Blood, UA     pH, UA 5.0 5.0 - 8.0   Protein, UA     Urobilinogen, UA  0.2 or 1.0 E.U./dL   Nitrite, UA     Leukocytes, UA  Negative   Appearance     Odor    Results were hard to make out due to urine having orange color from pt taking Azo  Patient relates urinary urgency frequency slight discomfort denies hematuria denies flank pain sweats chills Review of Systems  Genitourinary: Positive for frequency and urgency.  See above     Objective:   Physical Exam Respiratory rate is normal lungs are clear heart regular no murmurs flank nontender negative tenderness to percussion   Urinalysis with WBCs urine culture sent    Assessment & Plan:  UTI Antibiotics prescribed take for 5 days Diflucan as needed Urine culture sent await results

## 2017-12-24 LAB — URINE CULTURE

## 2018-01-21 ENCOUNTER — Encounter: Payer: Self-pay | Admitting: Nurse Practitioner

## 2018-01-21 ENCOUNTER — Other Ambulatory Visit: Payer: Self-pay | Admitting: Nurse Practitioner

## 2018-01-21 ENCOUNTER — Ambulatory Visit (INDEPENDENT_AMBULATORY_CARE_PROVIDER_SITE_OTHER): Payer: 59 | Admitting: Nurse Practitioner

## 2018-01-21 VITALS — BP 120/82 | HR 81 | Ht 64.0 in | Wt 200.8 lb

## 2018-01-21 DIAGNOSIS — Z0000 Encounter for general adult medical examination without abnormal findings: Secondary | ICD-10-CM

## 2018-01-21 DIAGNOSIS — N912 Amenorrhea, unspecified: Secondary | ICD-10-CM | POA: Diagnosis not present

## 2018-01-21 DIAGNOSIS — R7301 Impaired fasting glucose: Secondary | ICD-10-CM | POA: Diagnosis not present

## 2018-01-21 DIAGNOSIS — Z01419 Encounter for gynecological examination (general) (routine) without abnormal findings: Secondary | ICD-10-CM

## 2018-01-21 MED ORDER — FLUTICASONE PROPIONATE 50 MCG/ACT NA SUSP: 2.0000 | Freq: Every day | NASAL | 3 refills | Status: DC

## 2018-01-21 MED ORDER — PHENTERMINE-TOPIRAMATE 3.75-23 MG PO CP24
ORAL_CAPSULE | ORAL | 0 refills | Status: DC
Start: 2018-01-21 — End: 2018-01-21

## 2018-01-21 MED ORDER — PHENTERMINE-TOPIRAMATE ER 3.75-23 MG PO CP24: ORAL_CAPSULE | ORAL | 0 refills | Status: DC

## 2018-01-21 MED ORDER — METAXALONE 800 MG PO TABS: 800.0000 mg | ORAL_TABLET | Freq: Three times a day (TID) | ORAL | 0 refills | Status: DC

## 2018-01-21 MED ORDER — PHENTERMINE-TOPIRAMATE ER 7.5-46 MG PO CP24: ORAL_CAPSULE | ORAL | 1 refills | Status: DC

## 2018-01-21 MED ORDER — FLUCONAZOLE 150 MG PO TABS: ORAL_TABLET | ORAL | 3 refills | Status: DC

## 2018-01-21 MED ORDER — HYDROCHLOROTHIAZIDE 25 MG PO TABS: ORAL_TABLET | ORAL | 1 refills | Status: DC

## 2018-01-22 LAB — VITAMIN D 25 HYDROXY (VIT D DEFICIENCY, FRACTURES): Vit D, 25-Hydroxy: 38 ng/mL (ref 30.0–100.0)

## 2018-01-22 LAB — HEPATIC FUNCTION PANEL
ALT: 15 IU/L (ref 0–32)
AST: 15 IU/L (ref 0–40)
Albumin: 4.4 g/dL (ref 3.5–5.5)
Alkaline Phosphatase: 70 IU/L (ref 39–117)
BILIRUBIN TOTAL: 0.5 mg/dL (ref 0.0–1.2)
BILIRUBIN, DIRECT: 0.12 mg/dL (ref 0.00–0.40)
Total Protein: 6.5 g/dL (ref 6.0–8.5)

## 2018-01-22 LAB — BASIC METABOLIC PANEL
BUN / CREAT RATIO: 16 (ref 9–23)
BUN: 11 mg/dL (ref 6–24)
CHLORIDE: 103 mmol/L (ref 96–106)
CO2: 21 mmol/L (ref 20–29)
CREATININE: 0.7 mg/dL (ref 0.57–1.00)
Calcium: 9 mg/dL (ref 8.7–10.2)
GFR calc non Af Amer: 102 mL/min/{1.73_m2} (ref >59)
GFR, EST AFRICAN AMERICAN: 118 mL/min/{1.73_m2} (ref >59)
Glucose: 105 mg/dL — ABNORMAL HIGH (ref 65–99)
Potassium: 4.6 mmol/L (ref 3.5–5.2)
Sodium: 138 mmol/L (ref 134–144)

## 2018-01-22 LAB — LIPID PANEL
CHOL/HDL RATIO: 3.3 ratio (ref 0.0–4.4)
Cholesterol, Total: 188 mg/dL (ref 100–199)
HDL: 57 mg/dL (ref >39)
LDL Calculated: 112 mg/dL — ABNORMAL HIGH (ref 0–99)
TRIGLYCERIDES: 96 mg/dL (ref 0–149)
VLDL Cholesterol Cal: 19 mg/dL (ref 5–40)

## 2018-01-22 LAB — FOLLICLE STIMULATING HORMONE: FSH: 7.8 m[IU]/mL

## 2018-01-22 LAB — TSH: TSH: 3.38 u[IU]/mL (ref 0.450–4.500)

## 2018-01-24 ENCOUNTER — Encounter: Payer: Self-pay | Admitting: Nurse Practitioner

## 2018-01-24 NOTE — Progress Notes (Signed)
Subjective:    Patient ID: Ashley Mcintosh, female    DOB: Oct 20, 1965, 52 y.o.   MRN: [id number]  HPI Presents for her wellness exam. Has IUD. Rare bleeding. Same sexual partner. Regular vision and dental exams. Walks 2-3 miles 3-4 times per week. Has been under increased stress at work. Trying to eat healthy. Has lost some weight recently. Would like to try another med for weight loss. Occasional mild swelling in LE. Takes 1/2 HCTZ occasionally for this.     Review of Systems  Constitutional: Negative for activity change, appetite change and fatigue.  HENT: Negative for dental problem, ear pain, sinus pressure and sore throat.   Respiratory: Negative for cough, chest tightness, shortness of breath and wheezing.   Cardiovascular: Negative for chest pain.  Gastrointestinal: Negative for abdominal distention, abdominal pain, blood in stool, constipation, diarrhea, nausea and vomiting.  Genitourinary: Negative for difficulty urinating, dysuria, enuresis, frequency, genital sores, pelvic pain, urgency, vaginal bleeding and vaginal discharge.   Depression screen [facility] 2/9 01/21/2018 12/18/2016  Decreased Interest 0 0  Down, Depressed, Hopeless 1 0  PHQ - 2 Score 1 0        Objective:   Physical Exam  Constitutional: She is oriented to person, place, and time. She appears well-developed. No distress.  HENT:  Right Ear: External ear normal.  Left Ear: External ear normal.  Mouth/Throat: Oropharynx is clear and moist.  Neck: Normal range of motion. Neck supple. No tracheal deviation present. No thyromegaly present.  Cardiovascular: Normal rate, regular rhythm and normal heart sounds. Exam reveals no gallop.  No murmur heard. Pulmonary/Chest: Effort normal and breath sounds normal. Right breast exhibits no inverted nipple, no mass, no skin change and no tenderness. Left breast exhibits no inverted nipple, no mass, no skin change and no tenderness. Breasts are symmetrical.  Axillae no  adenopathy.   Abdominal: Soft. She exhibits no distension. There is no tenderness.  Genitourinary: Vagina normal and uterus normal. No vaginal discharge found.  Genitourinary Comments: External GU: no rashes or lesions. Vagina: no discharge. Cervix normal in appearance. No CMT. Uterus and adnexa no tenderness or obvious masses.   Musculoskeletal: She exhibits no edema.  Lymphadenopathy:    She has no cervical adenopathy.  Neurological: She is alert and oriented to person, place, and time.  Skin: Skin is warm and dry. No rash noted.  Psychiatric: She has a normal mood and affect. Her behavior is normal.          Assessment & Plan:   Problem List Items Addressed This Visit      Endocrine   Impaired fasting glucose   Relevant Orders   Lipid panel (Completed)   Hepatic function panel (Completed)   Basic metabolic panel (Completed)   TSH (Completed)   VITAMIN D 25 Hydroxy (Vit-D Deficiency, Fractures) (Completed)   FSH (Completed)    Other Visit Diagnoses    Well woman exam    -  Primary   Relevant Orders   Lipid panel (Completed)   Hepatic function panel (Completed)   Basic metabolic panel (Completed)   TSH (Completed)   VITAMIN D 25 Hydroxy (Vit-D Deficiency, Fractures) (Completed)   FSH (Completed)   Amenorrhea       Relevant Orders   Lipid panel (Completed)   Hepatic function panel (Completed)   Basic metabolic panel (Completed)   TSH (Completed)   VITAMIN D 25 Hydroxy (Vit-D Deficiency, Fractures) (Completed)   FSH (Completed)     Meds ordered this  encounter  Medications  . hydrochlorothiazide (HYDRODIURIL) 25 MG tablet    Sig: TAKE 1 TABLET IN THE MORNING AS NEEDED FOR FLUID RETENTION    Dispense:  90 tablet    Refill:  1    Order Specific Question:   Supervising Provider    Answer:   Merlyn AlbertLUKING, WILLIAM S [[id number]]  . fluticasone (FLONASE) 50 MCG/ACT nasal spray    Sig: Place 2 sprays into both nostrils daily.    Dispense:  48 g    Refill:  3    Order Specific  Question:   Supervising Provider    Answer:   Merlyn AlbertLUKING, WILLIAM S [[id number]]  . metaxalone (SKELAXIN) 800 MG tablet    Sig: Take 1 tablet (800 mg total) by mouth 3 (three) times daily. Prn muscle spasms    Dispense:  30 tablet    Refill:  0    Order Specific Question:   Supervising Provider    Answer:   Merlyn AlbertLUKING, WILLIAM S [[id number]]  . fluconazole (DIFLUCAN) 150 MG tablet    Sig: One po qd prn yeast infection; may repeat in 3-4 days if needed    Dispense:  2 tablet    Refill:  3    Order Specific Question:   Supervising Provider    Answer:   Merlyn AlbertLUKING, WILLIAM S [[id number]]  . DISCONTD: Phentermine-Topiramate (QSYMIA) 3.75-23 MG CP24    Sig: Take one po q am    Dispense:  14 capsule    Refill:  0    Order Specific Question:   Supervising Provider    Answer:   Merlyn AlbertLUKING, WILLIAM S [[id number]]  . DISCONTD: Phentermine-Topiramate (QSYMIA) 7.5-46 MG CP24    Sig: Take one po qam    Dispense:  30 capsule    Refill:  1    Order Specific Question:   Supervising Provider    Answer:   Merlyn AlbertLUKING, WILLIAM S [[id number]]   Discussed pregnancy warnings with Topiramate. Her IUD was placed in  April 2014. FSH pending. Patient plans to make an appointment to discuss insertion of another IUD. Continue weight loss efforts. DC Qsymia and contact office if any problems. Routine labs pending. Recheck in 3 months if she wishes to continue Qsymia. Return in about 1 year (around 01/22/2019) for physical.

## 2018-02-09 ENCOUNTER — Encounter

## 2018-02-09 ENCOUNTER — Ambulatory Visit: Payer: 59 | Admitting: Advanced Practice Midwife

## 2018-02-09 ENCOUNTER — Encounter: Payer: Self-pay | Admitting: Advanced Practice Midwife

## 2018-02-09 VITALS — BP 136/88 | HR 88 | Ht 64.0 in | Wt 204.0 lb

## 2018-02-09 DIAGNOSIS — Z3009 Encounter for other general counseling and advice on contraception: Secondary | ICD-10-CM | POA: Diagnosis not present

## 2018-02-09 MED ORDER — MISOPROSTOL 200 MCG PO TABS: ORAL_TABLET | ORAL | 0 refills | Status: DC

## 2018-02-09 NOTE — Progress Notes (Signed)
[facility] Visit  Patient name: Ashley SealsSharon H Mcintosh MRN [id number]  Date of birth: 16-Jan-1966  CC & HPI:  Ashley Mcintosh is a 52 y.o.  female presenting today for IUD consult. Has had Mirena for 5 years in April, rarely bleeds.  Would like to have it replaced.  Discussed that this would probably be her last one.   Pertinent History Reviewed:  Medical & Surgical Hx:   Past Medical History:  Diagnosis Date  . Allergy   . Impaired fasting glucose   . PONV (postoperative nausea and vomiting)    nausea  . Seasonal affective disorder (HCC)   . Seasonal allergies    Past Surgical History:  Procedure Laterality Date  . DILATION AND CURETTAGE OF UTERUS    . EYE SURGERY     lasik  . LIPOSUCTION    . LUMBAR LAMINECTOMY/DECOMPRESSION MICRODISCECTOMY Left 08/04/2013   Procedure: LUMBAR LAMINECTOMY/DECOMPRESSION MICRODISCECTOMY 1 LEVEL;  Surgeon: Carmela HurtKyle L Cabbell, MD;  Location: [facility];  Service: Neurosurgery;  Laterality: Left;  Left L5S1 microdiskectomy  . LUMBAR LAMINECTOMY/DECOMPRESSION MICRODISCECTOMY Left 07/30/2014   Procedure: Left Lumbar five-Sacral one microdiskectomy;  Surgeon: Coletta MemosKyle Cabbell, MD;  Location: [facility];  Service: Neurosurgery;  Laterality: Left;  Left Lumbar five-Sacral one microdiskectomy   Family History  Problem Relation Age of Onset  . Hypertension Mother   . Diabetes Mother   . Cancer Brother        leukemia  . Cancer Paternal Aunt        breast  . Stroke Paternal Grandfather   . Diabetes Maternal Grandmother   . Parkinson's disease Maternal Grandfather   . Diabetes Father     Current Outpatient Medications:  .  fluconazole (DIFLUCAN) 150 MG tablet, One po qd prn yeast infection; may repeat in 3-4 days if needed, Disp: 2 tablet, Rfl: 3 .  fluticasone (FLONASE) 50 MCG/ACT nasal spray, Place 2 sprays into both nostrils daily., Disp: 48 g, Rfl: 3 .  hydrochlorothiazide (HYDRODIURIL) 25 MG tablet, TAKE 1 TABLET IN THE MORNING AS NEEDED FOR  FLUID RETENTION (Patient taking differently: 12.5 mg. TAKE 1 TABLET IN THE MORNING AS NEEDED FOR FLUID RETENTION), Disp: 90 tablet, Rfl: 1 .  levonorgestrel (MIRENA) 20 MCG/24HR IUD, 1 each by Intrauterine route once. Reported on 08/16/2015, Disp: , Rfl:  .  metaxalone (SKELAXIN) 800 MG tablet, Take 1 tablet (800 mg total) by mouth 3 (three) times daily. Prn muscle spasms, Disp: 30 tablet, Rfl: 0 .  naproxen sodium (ALEVE) 220 MG tablet, Take 440 mg by mouth as needed. Reported on 08/16/2015, Disp: , Rfl:  .  Phentermine-Topiramate (QSYMIA) 3.75-23 MG CP24, Take by mouth daily., Disp: , Rfl:  .  misoprostol (CYTOTEC) 200 MCG tablet, Take 5-6 hours before appointment, Disp: 3 tablet, Rfl: 0 Social History: Reviewed -  reports that she has never smoked. She has never used smokeless tobacco.  Review of Systems:   Constitutional: Negative for fever and chills Eyes: Negative for visual disturbances Respiratory: Negative for shortness of breath, dyspnea Cardiovascular: Negative for chest pain or palpitations  Gastrointestinal: Negative for vomiting, diarrhea and constipation; no abdominal pain Genitourinary: Negative for dysuria and urgency, vaginal irritation or itching Musculoskeletal: Negative for back pain, joint pain, myalgias  Neurological: Negative for dizziness and headaches    Objective Findings:    Physical Examination: Vitals:   02/09/18 1504  BP: 136/88  Pulse: 88   General appearance - well appearing, and in no distress Mental status -  alert, oriented to person, place, and time Chest:  Normal respiratory effort Heart - normal rate and regular rhythm Musculoskeletal:  Normal range of motion without pain Extremities:  No edema    No results found for this or any previous visit (from the past 24 hour(s)).    Assessment & Plan:  A:   IUD consult P:  Premedicate w/cytotec   Return for asap for IUD removal and replacement.  Jacklyn Shell CNM 02/09/2018 4:03  PM

## 2018-02-22 ENCOUNTER — Encounter: Payer: Self-pay | Admitting: Advanced Practice Midwife

## 2018-02-22 ENCOUNTER — Ambulatory Visit: Payer: 59 | Admitting: Advanced Practice Midwife

## 2018-02-22 VITALS — BP 129/84 | HR 84 | Ht 64.0 in | Wt 204.0 lb

## 2018-02-22 DIAGNOSIS — Z30433 Encounter for removal and reinsertion of intrauterine contraceptive device: Secondary | ICD-10-CM

## 2018-02-22 DIAGNOSIS — Z3202 Encounter for pregnancy test, result negative: Secondary | ICD-10-CM | POA: Diagnosis not present

## 2018-02-22 DIAGNOSIS — Z30430 Encounter for insertion of intrauterine contraceptive device: Secondary | ICD-10-CM

## 2018-02-22 DIAGNOSIS — Z30432 Encounter for removal of intrauterine contraceptive device: Secondary | ICD-10-CM

## 2018-02-22 LAB — POCT URINE PREGNANCY: PREG TEST UR: NEGATIVE

## 2018-02-22 MED ORDER — LEVONORGESTREL 20 MCG/24HR IU IUD
INTRAUTERINE_SYSTEM | Freq: Once | INTRAUTERINE | Status: AC
  Administered 2018-02-22: 17:00:00 via INTRAUTERINE

## 2018-02-22 NOTE — Progress Notes (Signed)
Humberto SealsSharon H Facchini is a 52 y.o. year old  female   who presents for removal and replacement of her IUD. The new IUD is Mirena. It has been 5 years since her previous IUD placement.  The risks and benefits of the method and placement have been thouroughly reviewed with the patient and all questions were answered.  Specifically the patient is aware of failure rate of 08/998, expulsion of the IUD and of possible perforation.  The patient is aware of irregular bleeding due to the method and understands the incidence of irregular bleeding diminishes with time.  Time out was performed.  A Graves speculum was placed.  The cervix was prepped using Betadine. The strings were found to be not visible.  Tried w/o succes, Dr. Despina HiddenEure in and removed intact IUD.The cervix was then grasped with a tenaculum and the uterus was sounded to 7 cm. The IUD was inserted to 7 cm.  It was pulled back 1 cm and the IUD was disengaged. After 15 seconds, the IUD was placed in the fundus.  The strings were trimmed to 3 cm.  Sonogram was performed and the proper placement of the IUD was verified.  The patient was instructed on signs and symptoms of infection and to check for the strings after each menses or each month.  The patient is to refrain from intercourse for 3 days.

## 2018-02-24 DIAGNOSIS — Z30430 Encounter for insertion of intrauterine contraceptive device: Secondary | ICD-10-CM | POA: Insufficient documentation

## 2018-03-03 DIAGNOSIS — L918 Other hypertrophic disorders of the skin: Secondary | ICD-10-CM | POA: Diagnosis not present

## 2018-03-03 DIAGNOSIS — L820 Inflamed seborrheic keratosis: Secondary | ICD-10-CM | POA: Diagnosis not present

## 2018-03-03 DIAGNOSIS — L821 Other seborrheic keratosis: Secondary | ICD-10-CM | POA: Diagnosis not present

## 2018-06-30 ENCOUNTER — Encounter: Payer: Self-pay | Admitting: Family Medicine

## 2018-07-04 ENCOUNTER — Other Ambulatory Visit: Payer: Self-pay

## 2018-07-04 MED ORDER — BUPROPION HCL ER (XL) 150 MG PO TB24: 150.0000 mg | ORAL_TABLET | Freq: Every day | ORAL | 1 refills | Status: DC

## 2018-07-04 MED ORDER — FLUTICASONE PROPIONATE 50 MCG/ACT NA SUSP: 2.0000 | Freq: Every day | NASAL | 3 refills | Status: DC

## 2018-08-01 ENCOUNTER — Other Ambulatory Visit: Payer: Self-pay | Admitting: Family Medicine

## 2018-08-01 DIAGNOSIS — Z1231 Encounter for screening mammogram for malignant neoplasm of breast: Secondary | ICD-10-CM

## 2018-08-04 ENCOUNTER — Ambulatory Visit
Admission: RE | Admit: 2018-08-04 | Discharge: 2018-08-04 | Disposition: A | Discharge status: Home / Self Care | Payer: 59 | Source: Ambulatory Visit | Attending: Family Medicine | Admitting: Family Medicine

## 2018-08-04 DIAGNOSIS — Z1231 Encounter for screening mammogram for malignant neoplasm of breast: Secondary | ICD-10-CM | POA: Diagnosis not present

## 2018-09-12 DIAGNOSIS — G5763 Lesion of plantar nerve, bilateral lower limbs: Secondary | ICD-10-CM | POA: Diagnosis not present

## 2018-10-25 DIAGNOSIS — G5763 Lesion of plantar nerve, bilateral lower limbs: Secondary | ICD-10-CM | POA: Diagnosis not present

## 2018-12-07 ENCOUNTER — Encounter: Payer: Self-pay | Admitting: Family Medicine

## 2018-12-07 ENCOUNTER — Other Ambulatory Visit: Payer: Self-pay | Admitting: *Deleted

## 2018-12-07 MED ORDER — PHENTERMINE-TOPIRAMATE ER 3.75-23 MG PO CP24: ORAL_CAPSULE | ORAL | 2 refills | Status: DC

## 2018-12-07 MED ORDER — PHENTERMINE HCL 37.5 MG PO TABS: 37.5000 mg | ORAL_TABLET | Freq: Every day | ORAL | 2 refills | Status: DC

## 2018-12-07 NOTE — Telephone Encounter (Signed)
Last visit was wellness on 01/21/18. Do you want to refill or do virtual visit for weight loss med?

## 2019-03-16 ENCOUNTER — Other Ambulatory Visit: Payer: Self-pay

## 2019-03-17 ENCOUNTER — Encounter: Payer: Self-pay | Admitting: Nurse Practitioner

## 2019-03-17 ENCOUNTER — Ambulatory Visit (INDEPENDENT_AMBULATORY_CARE_PROVIDER_SITE_OTHER): Payer: 59 | Admitting: Nurse Practitioner

## 2019-03-17 ENCOUNTER — Other Ambulatory Visit: Payer: Self-pay

## 2019-03-17 VITALS — BP 124/84 | Temp 97.3°F | Ht 63.0 in | Wt 210.0 lb

## 2019-03-17 DIAGNOSIS — Z0000 Encounter for general adult medical examination without abnormal findings: Secondary | ICD-10-CM | POA: Diagnosis not present

## 2019-03-17 DIAGNOSIS — Z1211 Encounter for screening for malignant neoplasm of colon: Secondary | ICD-10-CM

## 2019-03-17 DIAGNOSIS — R7303 Prediabetes: Secondary | ICD-10-CM

## 2019-03-17 DIAGNOSIS — Z13220 Encounter for screening for lipoid disorders: Secondary | ICD-10-CM

## 2019-03-17 MED ORDER — FLUTICASONE PROPIONATE 50 MCG/ACT NA SUSP: 2.0000 | Freq: Every day | NASAL | 3 refills | Status: DC

## 2019-03-17 NOTE — Patient Instructions (Signed)
Review the medication Saxenda

## 2019-03-17 NOTE — Progress Notes (Signed)
Subjective:    Patient ID: Ashley SealsSharon H Mcintosh, female    DOB: 1966/03/21, 53 y.o.   MRN: [id number]  HPI The patient comes in today for a wellness visit. Has an IUD with no problems. Reports same sexual partner. Decrease physical activity related to gym closed and hot weather. Trying to eat healthy. Reports phentermine medication, not working for weight loss. Would like to try another medication. Pt reports increased stress and depression related to father passing and mother being sick. Patient has a history of seasonal affective disorder. Denies SI/HI. Regular dental and eye exams.    A review of their health history was completed. A review of medications was also completed.  Any needed refills: flonase   Falls/  MVA accidents in past few months: none  Specialist pt sees on regular basis: dermatology; regular skin cancer screenings  Preventative health issues were discussed.   Additional concerns: discuss phentermine    Review of Systems  Constitutional: Positive for fatigue. Negative for chills and fever.  HENT: Negative for ear pain, sinus pain, sore throat and trouble swallowing.   Respiratory: Negative for cough, shortness of breath and wheezing.   Cardiovascular: Negative for chest pain and leg swelling.  Gastrointestinal: Negative for abdominal distention, constipation, diarrhea, nausea and vomiting.  Endocrine: Negative for cold intolerance and heat intolerance.  Genitourinary: Negative for difficulty urinating, dyspareunia, dysuria, flank pain, frequency, genital sores, menstrual problem, pelvic pain and vaginal discharge.  Musculoskeletal: Negative.   Skin: Negative for color change and rash.  Psychiatric/Behavioral: Negative for sleep disturbance and suicidal ideas. The patient is not nervous/anxious.    [facility] 2/9 03/17/2019 02/09/2018 01/21/2018 12/18/2016  Decreased Interest 1 0 0 0  Down, Depressed, Hopeless 1 0 1 0  PHQ - 2 Score 2 0 1 0  Altered  sleeping 2 - - -  Tired, decreased energy 1 - - -  Change in appetite 1 - - -  Feeling bad or failure about yourself  1 - - -  Trouble concentrating 1 - - -  Moving slowly or fidgety/restless 0 - - -  Suicidal thoughts 0 - - -  PHQ-9 Score 8 - - -  Difficult doing work/chores Somewhat difficult - - -   Reviewed and discussed with patient.     Objective:   Physical Exam Constitutional:      Appearance: Normal appearance.  HENT:     Head: Normocephalic.  Neck:     Musculoskeletal: Normal range of motion.     Thyroid: No thyromegaly.  Cardiovascular:     Rate and Rhythm: Normal rate and regular rhythm.     Pulses: Normal pulses.     Heart sounds: Normal heart sounds.  Pulmonary:     Effort: Pulmonary effort is normal.     Breath sounds: Normal breath sounds.  Chest:     Chest wall: No mass, deformity or swelling.     Breasts:        Right: Normal. No swelling, mass or nipple discharge.        Left: Normal. No swelling, mass or nipple discharge.  Abdominal:     General: Abdomen is flat. There is no distension.     Palpations: Abdomen is soft. There is no mass.  Genitourinary:    Labia:        Right: No rash or lesion.        Left: No rash or lesion.      Comments: Bimanual exam: no CMT, no  obvious mass or tenderness during examination, examination limited due to abdominal girth Musculoskeletal: Normal range of motion.  Lymphadenopathy:     Cervical: No cervical adenopathy.     Upper Body:     Right upper body: No supraclavicular, axillary or pectoral adenopathy.     Left upper body: No supraclavicular, axillary or pectoral adenopathy.  Skin:    General: Skin is warm and dry.  Neurological:     Mental Status: She is alert and oriented to person, place, and time.  Psychiatric:        Mood and Affect: Mood normal.          Assessment & Plan:   Problem List Items Addressed This Visit      Other   Morbid obesity ([place])    Other Visit Diagnoses    Wellness  examination    -  Primary   Relevant Orders   Comprehensive metabolic panel   Lipid panel   Pre-diabetes       Relevant Orders   Hemoglobin A1c   Screening for colon cancer       Relevant Orders   Ambulatory referral to Gastroenterology   Screening for lipid disorders       Relevant Orders   Lipid panel      Meds ordered this encounter  Medications  . fluticasone (FLONASE) 50 MCG/ACT nasal spray    Sig: Place 2 sprays into both nostrils daily.    Dispense:  48 g    Refill:  3    Order Specific Question:   Supervising Provider    Answer:   Erma limited sugars and increase exercise. Recommendation medication Saxenda for weight loss, pt wants to research mediation and verify insurance before prescribing. Referral provided for colonoscopy screening. Return in 1 year for follow-up.

## 2019-03-27 ENCOUNTER — Encounter: Payer: Self-pay | Admitting: Family Medicine

## 2019-03-28 ENCOUNTER — Encounter (INDEPENDENT_AMBULATORY_CARE_PROVIDER_SITE_OTHER): Payer: Self-pay | Admitting: *Deleted

## 2019-04-04 ENCOUNTER — Other Ambulatory Visit: Payer: Self-pay | Admitting: Nurse Practitioner

## 2019-04-04 ENCOUNTER — Encounter: Payer: Self-pay | Admitting: Family Medicine

## 2019-04-04 MED ORDER — FLUTICASONE PROPIONATE 50 MCG/ACT NA SUSP: 2.0000 | Freq: Every day | NASAL | 3 refills | Status: DC

## 2019-08-24 ENCOUNTER — Other Ambulatory Visit: Payer: Self-pay

## 2019-08-24 ENCOUNTER — Ambulatory Visit (INDEPENDENT_AMBULATORY_CARE_PROVIDER_SITE_OTHER): Payer: 59 | Admitting: Family Medicine

## 2019-08-24 DIAGNOSIS — F321 Major depressive disorder, single episode, moderate: Secondary | ICD-10-CM | POA: Diagnosis not present

## 2019-08-24 MED ORDER — ESCITALOPRAM OXALATE 10 MG PO TABS: ORAL_TABLET | ORAL | 0 refills | Status: DC

## 2019-08-24 NOTE — Progress Notes (Signed)
   Subjective:    Patient ID: Ashley Mcintosh, female    DOB: [birth year], 54 y.o.   MRN: [id number]  Foot Pain This is a new problem. Episode onset: around Christmas. Associated symptoms comments: Left foot affected. Pt is compensation with right foot so by the end of the day, her foot pain is worse on both feet. Treatments tried: ibuprofen, ice, stretches.  Pt states that provider has injected her husbands feet before sometime last year and that has helped him.   Pt states that every winter she is prescribed generic Wellbutrin by Eber Jones. She usually takes it from October to March. Pt states Wellbutrin is not helping. Pt has issues with sleeping and states that a friend of hers is on a med that helps with sleep but is unsure of the name. Pt does not know anything about depression meds, she would like provider input.   Virtual Visit via Telephone Note  I connected with Ashley Mcintosh on 08/24/19 at  3:00 PM EST by telephone and verified that I am speaking with the correct person using two identifiers.  Location: Patient: home Provider: office   I discussed the limitations, risks, security and privacy concerns of performing an evaluation and management service by telephone and the availability of in person appointments. I also discussed with the patient that there may be a patient responsible charge related to this service. The patient expressed understanding and agreed to proceed.   History of Present Illness:    Observations/Objective:   Assessment and Plan:   Follow Up Instructions:    I discussed the assessment and treatment plan with the patient. The patient was provided an opportunity to ask questions and all were answered. The patient agreed with the plan and demonstrated an understanding of the instructions.   The patient was advised to call back or seek an in-person evaluation if the symptoms worsen or if the condition fails to improve as anticipated.  I provided 30  minutes of non-face-to-face time during this encounter.  Works from home      Review of Systems No headache, no major weight loss or weight gain, no chest pain no back pain abdominal pain no change in bowel habits complete ROS otherwise negative     Objective:   Physical Exam Virtual       Assessment & Plan:  Impression moderate depression. Protracted. Through the winter. Exacerbated by mother's worsening dementia and Covid illness. Exacerbated by father's death back in the spring with inability to do usual funeral processes. Also stress with work. Also complicated by history of seasonal affective disorder. In addition having taken Wellbutrin in the past somewhat mild dose, which now is not beneficial at all. Long discussion held regarding the exogenous and endogenous nature depression. Benefits from medication. New alternatives. Side effects benefits discussed. Lexapro 10 mg initiated. Patient to call in several weeks with status. Patient to follow-up in 2 months.  Also notes heel fasciitis offered patient to come in for injection if she feels she needs it measures discussed plantar fasciitis

## 2019-08-25 ENCOUNTER — Encounter: Payer: Self-pay | Admitting: Family Medicine

## 2019-09-13 ENCOUNTER — Encounter: Payer: Self-pay | Admitting: Family Medicine

## 2019-09-16 ENCOUNTER — Other Ambulatory Visit: Payer: Self-pay | Admitting: Family Medicine

## 2019-09-21 ENCOUNTER — Encounter: Payer: Self-pay | Admitting: Family Medicine

## 2019-09-25 MED ORDER — ESCITALOPRAM OXALATE 20 MG PO TABS: ORAL_TABLET | ORAL | 1 refills | Status: DC

## 2019-12-19 ENCOUNTER — Other Ambulatory Visit: Payer: Self-pay

## 2019-12-19 ENCOUNTER — Ambulatory Visit: Payer: 59 | Attending: Internal Medicine

## 2019-12-19 DIAGNOSIS — Z20822 Contact with and (suspected) exposure to covid-19: Secondary | ICD-10-CM

## 2019-12-20 LAB — SARS-COV-2, NAA 2 DAY TAT

## 2019-12-20 LAB — NOVEL CORONAVIRUS, NAA: SARS-CoV-2, NAA: NOT DETECTED

## 2019-12-21 ENCOUNTER — Encounter: Payer: Self-pay | Admitting: Family Medicine

## 2019-12-22 ENCOUNTER — Ambulatory Visit (INDEPENDENT_AMBULATORY_CARE_PROVIDER_SITE_OTHER): Payer: 59 | Admitting: Family Medicine

## 2019-12-22 ENCOUNTER — Other Ambulatory Visit: Payer: Self-pay

## 2019-12-22 ENCOUNTER — Telehealth: Payer: Self-pay | Admitting: *Deleted

## 2019-12-22 DIAGNOSIS — B379 Candidiasis, unspecified: Secondary | ICD-10-CM | POA: Diagnosis not present

## 2019-12-22 DIAGNOSIS — J0100 Acute maxillary sinusitis, unspecified: Secondary | ICD-10-CM

## 2019-12-22 MED ORDER — FLUCONAZOLE 150 MG PO TABS: ORAL_TABLET | ORAL | 0 refills | Status: DC

## 2019-12-22 MED ORDER — CEFDINIR 300 MG PO CAPS: 300.0000 mg | ORAL_CAPSULE | Freq: Two times a day (BID) | ORAL | 0 refills | Status: DC

## 2019-12-22 NOTE — Progress Notes (Signed)
Patient ID: SUMAYYAH CUSTODIO, female    DOB: Feb 24, 1966, 54 y.o.   MRN: [id number]   Virtual Visit via phone Note  I connected with Humberto Seals on 12/22/19 at  9:30 AM EDT by a phone enabled telemedicine application and verified that I am speaking with the correct person using two identifiers.  Location: Patient: car Provider: office   I discussed the limitations of evaluation and management by telemedicine and the availability of in person appointments. The patient expressed understanding and agreed to proceed.   Chief Complaint  Patient presents with  . Sinusitis    cough and sinus pressure since Sunday   Subjective:    HPI Pt having sinus symptoms, pressure under cheeks,  Nagging coughing, all day. Dry cough, but yesterday started getting productive yellowish sputum. Worse at night with laying down. vomited due to coughing and very deep. Started 5 days ago.  Was outside over weekend, and in garden. Worsening after that.  No fever.   +Post nasal drip.  No stuffy nose. Sick contacts- husband with coughing. covid contacts- not known. Got a covid testing 3 days ago and was negative. meds- sinus otc.sudafed, alka-seltzer tablets, nyquil.   Medical History Rosezella has a past medical history of Allergy, Impaired fasting glucose, PONV (postoperative nausea and vomiting), Seasonal affective disorder (HCC), and Seasonal allergies.   Outpatient Encounter Medications as of 12/22/2019  Medication Sig  . cefdinir (OMNICEF) 300 MG capsule Take 1 capsule (300 mg total) by mouth 2 (two) times daily.  Marland Kitchen escitalopram (LEXAPRO) 20 MG tablet TAKE 1 TABLET BY MOUTH IN THE MORNING  . fluconazole (DIFLUCAN) 150 MG tablet Take 1 tab p.o. once if having vaginal itching, may repeat in 7 days.  . fluticasone (FLONASE) 50 MCG/ACT nasal spray Place 2 sprays into both nostrils daily.  . hydrochlorothiazide (HYDRODIURIL) 25 MG tablet TAKE 1 TABLET IN THE MORNING AS NEEDED FOR FLUID RETENTION  (Patient taking differently: 12.5 mg. TAKE 1 TABLET IN THE MORNING AS NEEDED FOR FLUID RETENTION)  . levonorgestrel (MIRENA) 20 MCG/24HR IUD 1 each by Intrauterine route once. Reported on 08/16/2015  . metaxalone (SKELAXIN) 800 MG tablet Take 1 tablet (800 mg total) by mouth 3 (three) times daily. Prn muscle spasms (Patient not taking: Reported on 08/24/2019)  . naproxen sodium (ALEVE) 220 MG tablet Take 440 mg by mouth as needed. Reported on 08/16/2015   No facility-administered encounter medications on file as of 12/22/2019.     Review of Systems  Constitutional: Negative for chills and fever.  HENT: Positive for postnasal drip, sinus pressure, sinus pain and sore throat. Negative for congestion, ear pain, rhinorrhea and sneezing.   Eyes: Negative for pain, discharge and itching.  Respiratory: Positive for cough. Negative for shortness of breath.   Gastrointestinal: Negative for constipation, diarrhea and nausea.     Vitals There were no vitals taken for this visit.  Objective:   Physical Exam  No PE due to phone visit.  Assessment and Plan   1. Acute non-recurrent maxillary sinusitis - cefdinir (OMNICEF) 300 MG capsule; Take 1 capsule (300 mg total) by mouth 2 (two) times daily.  Dispense: 20 capsule; Refill: 0  2. Antibiotic-induced yeast infection - fluconazole (DIFLUCAN) 150 MG tablet; Take 1 tab p.o. once if having vaginal itching, may repeat in 7 days.  Dispense: 2 tablet; Refill: 0   Gave cefdinir for sinusitis.  Reviewed viral illness vs. Sinusitis.  Risk vs. Benefits of treatment.  Advising symptomatic tx for viral illness.  And if worsening may start the cefdinir. Take --Tylenol, cough syrup otc and ibuprofen prn. increase fluids and call if not improving in next 3-5 days.     Pt in agreement.   F/u prn.    Follow Up Instructions:    I discussed the assessment and treatment plan with the patient. The patient was provided an opportunity to ask questions and all  were answered. The patient agreed with the plan and demonstrated an understanding of the instructions.   The patient was advised to call back or seek an in-person evaluation if the symptoms worsen or if the condition fails to improve as anticipated.  I provided 12  minutes of non-face-to-face time during this encounter.

## 2019-12-22 NOTE — Telephone Encounter (Signed)
[name], campos are scheduled for a virtual visit with your provider today.    Just as we do with appointments in the office, we must obtain your consent to participate.  Your consent will be active for this visit and any virtual visit you may have with one of our providers in the next 365 days.    If you have a MyChart account, I can also send a copy of this consent to you electronically.  All virtual visits are billed to your insurance company just like a traditional visit in the office.  As this is a virtual visit, video technology does not allow for your provider to perform a traditional examination.  This may limit your provider's ability to fully assess your condition.  If your provider identifies any concerns that need to be evaluated in person or the need to arrange testing such as labs, EKG, etc, we will make arrangements to do so.    Although advances in technology are sophisticated, we cannot ensure that it will always work on either your end or our end.  If the connection with a video visit is poor, we may have to switch to a telephone visit.  With either a video or telephone visit, we are not always able to ensure that we have a secure connection.   I need to obtain your verbal consent now.   Are you willing to proceed with your visit today?   Ashley Mcintosh has provided verbal consent on 12/22/2019 for a virtual visit (video or telephone).   [name], RN 12/22/2019  9:25 AM

## 2019-12-25 ENCOUNTER — Telehealth: Payer: Self-pay | Admitting: Family Medicine

## 2019-12-25 DIAGNOSIS — R059 Cough, unspecified: Secondary | ICD-10-CM

## 2019-12-25 MED ORDER — CHERATUSSIN AC 100-10 MG/5ML PO SOLN: 5.0000 mL | Freq: Three times a day (TID) | ORAL | 0 refills | Status: DC | PRN

## 2019-12-25 NOTE — Telephone Encounter (Signed)
Pt would like to have the cough medication called in that Dr.Taylor had mentioned on Friday. Pt states the Nyquil is helping some but still waking up at night coughing.   [id number] - , [place] - [address] AT [facility]

## 2019-12-25 NOTE — Telephone Encounter (Signed)
Please advise. Thank you

## 2019-12-26 NOTE — Telephone Encounter (Signed)
Left message to return call 

## 2019-12-26 NOTE — Telephone Encounter (Signed)
Pt returned call and verbalized understanding  

## 2020-08-16 ENCOUNTER — Other Ambulatory Visit: Payer: Self-pay

## 2020-08-16 ENCOUNTER — Telehealth (INDEPENDENT_AMBULATORY_CARE_PROVIDER_SITE_OTHER): Payer: 59 | Admitting: Family Medicine

## 2020-08-16 ENCOUNTER — Encounter: Payer: Self-pay | Admitting: Family Medicine

## 2020-08-16 DIAGNOSIS — R0981 Nasal congestion: Secondary | ICD-10-CM

## 2020-08-16 DIAGNOSIS — U071 COVID-19: Secondary | ICD-10-CM

## 2020-08-16 MED ORDER — FLUTICASONE PROPIONATE 50 MCG/ACT NA SUSP: 2.0000 | Freq: Every day | NASAL | 1 refills | Status: DC

## 2020-08-16 NOTE — Progress Notes (Signed)
Patient ID: Ashley Mcintosh, female    DOB: February 04, 1966, 55 y.o.   MRN: [id number]   Virtual Visit via Telephone Note  I connected with Humberto Seals on 08/16/20 at  9:00 AM EST by telephone and verified that I am speaking with the correct person using two identifiers.  Location: Patient: home Provider: office   I discussed the limitations, risks, security and privacy concerns of performing an evaluation and management service by telephone and the availability of in person appointments. I also discussed with the patient that there may be a patient responsible charge related to this service. The patient expressed understanding and agreed to proceed.   Chief Complaint  Patient presents with  . Covid Positive   Subjective:    HPI   Pt stating took covid test on 12/30 and pt states it was positive. Having sinus infection, head fuzzy, cheeks tight, ear pain off and on, yesterday glands are swollen and having a sore throat.  Feeling better with her energy.  And headaches improved.  Sinus pressure and ears having pain intermittent during day. Glands in throat are swollen, but not having pain in throat or sore throat. Pain in face, no drainage in nose. No fever.  meds- sudafed and sinus otc meds.  Helping some.   Pt stating she has 1-2x per year sinusitis.    Medical History Calaya has a past medical history of Allergy, Impaired fasting glucose, PONV (postoperative nausea and vomiting), Seasonal affective disorder (HCC), and Seasonal allergies.   Outpatient Encounter Medications as of 08/16/2020  Medication Sig  . hydrochlorothiazide (HYDRODIURIL) 25 MG tablet TAKE 1 TABLET IN THE MORNING AS NEEDED FOR FLUID RETENTION (Patient taking differently: 12.5 mg. TAKE 1 TABLET IN THE MORNING AS NEEDED FOR FLUID RETENTION)  . levonorgestrel (MIRENA) 20 MCG/24HR IUD 1 each by Intrauterine route once. Reported on 08/16/2015  . naproxen sodium (ALEVE) 220 MG tablet Take 440 mg by mouth as  needed. Reported on 08/16/2015  . [DISCONTINUED] escitalopram (LEXAPRO) 20 MG tablet TAKE 1 TABLET BY MOUTH IN THE MORNING  . [DISCONTINUED] fluticasone (FLONASE) 50 MCG/ACT nasal spray Place 2 sprays into both nostrils daily.  . fluticasone (FLONASE) 50 MCG/ACT nasal spray Place 2 sprays into both nostrils daily.  . [DISCONTINUED] cefdinir (OMNICEF) 300 MG capsule Take 1 capsule (300 mg total) by mouth 2 (two) times daily.  . [DISCONTINUED] fluconazole (DIFLUCAN) 150 MG tablet Take 1 tab p.o. once if having vaginal itching, may repeat in 7 days.  . [DISCONTINUED] guaiFENesin-codeine (CHERATUSSIN AC) 100-10 MG/5ML syrup Take 5 mLs by mouth 3 (three) times daily as needed for cough.  . [DISCONTINUED] metaxalone (SKELAXIN) 800 MG tablet Take 1 tablet (800 mg total) by mouth 3 (three) times daily. Prn muscle spasms (Patient not taking: Reported on 08/24/2019)   No facility-administered encounter medications on file as of 08/16/2020.     Review of Systems  Constitutional: Negative for chills, fatigue and fever.  HENT: Positive for congestion, ear pain and sinus pressure. Negative for postnasal drip, rhinorrhea, sinus pain and sore throat.   Eyes: Negative for pain, discharge and itching.  Respiratory: Positive for cough.   Gastrointestinal: Negative for constipation, diarrhea, nausea and vomiting.     Vitals There were no vitals taken for this visit.  Objective:   Physical Exam  No PE due to phone visit. Assessment and Plan   1. COVID-19  2. Nasal congestion   Likely having residual viral illness from her covid 19 infection. Pt likely  having viral nasal congestion and serous effusion in ear causing her ear pain.  Pain is intermittent and no fever or severe symptoms.  Advising to take symptomatic tx. With flonase daily, saline sinus rinses, tylenol/ibuprofen, and sudafed otc.  If not improving in 2-3 days to call or rto.   F/u prn.  Follow Up Instructions:    I discussed the  assessment and treatment plan with the patient. The patient was provided an opportunity to ask questions and all were answered. The patient agreed with the plan and demonstrated an understanding of the instructions.   The patient was advised to call back or seek an in-person evaluation if the symptoms worsen or if the condition fails to improve as anticipated.  I provided 15 minutes of non-face-to-face time during this encounter.

## 2020-11-27 ENCOUNTER — Encounter: Payer: Self-pay | Admitting: Adult Health

## 2020-11-27 ENCOUNTER — Other Ambulatory Visit (HOSPITAL_COMMUNITY)
Admission: RE | Admit: 2020-11-27 | Discharge: 2020-11-27 | Disposition: A | Discharge status: Home / Self Care | Payer: 59 | Source: Ambulatory Visit | Attending: Adult Health | Admitting: Adult Health

## 2020-11-27 ENCOUNTER — Other Ambulatory Visit: Payer: Self-pay

## 2020-11-27 ENCOUNTER — Ambulatory Visit (INDEPENDENT_AMBULATORY_CARE_PROVIDER_SITE_OTHER): Payer: 59 | Admitting: Adult Health

## 2020-11-27 VITALS — BP 117/71 | HR 90 | Ht 64.0 in | Wt 232.2 lb

## 2020-11-27 DIAGNOSIS — Z1231 Encounter for screening mammogram for malignant neoplasm of breast: Secondary | ICD-10-CM | POA: Insufficient documentation

## 2020-11-27 DIAGNOSIS — Z01419 Encounter for gynecological examination (general) (routine) without abnormal findings: Secondary | ICD-10-CM | POA: Diagnosis not present

## 2020-11-27 DIAGNOSIS — F32A Depression, unspecified: Secondary | ICD-10-CM | POA: Diagnosis not present

## 2020-11-27 DIAGNOSIS — R232 Flushing: Secondary | ICD-10-CM | POA: Insufficient documentation

## 2020-11-27 DIAGNOSIS — Z1212 Encounter for screening for malignant neoplasm of rectum: Secondary | ICD-10-CM

## 2020-11-27 DIAGNOSIS — Z975 Presence of (intrauterine) contraceptive device: Secondary | ICD-10-CM | POA: Insufficient documentation

## 2020-11-27 DIAGNOSIS — Z131 Encounter for screening for diabetes mellitus: Secondary | ICD-10-CM | POA: Insufficient documentation

## 2020-11-27 DIAGNOSIS — Z1211 Encounter for screening for malignant neoplasm of colon: Secondary | ICD-10-CM | POA: Diagnosis not present

## 2020-11-27 LAB — HEMOCCULT GUIAC POC 1CARD (OFFICE): Fecal Occult Blood, POC: NEGATIVE

## 2020-11-27 MED ORDER — VENLAFAXINE HCL ER 37.5 MG PO CP24
37.5000 mg | ORAL_CAPSULE | Freq: Every day | ORAL | 3 refills | Status: DC
Start: 2020-11-27 — End: 2020-12-20

## 2020-11-27 NOTE — Progress Notes (Signed)
Patient ID: Ashley Mcintosh, female   DOB: [birth year], 55 y.o.   MRN: [id number] History of Present Illness: Trent is a 55 year old white female, married, [id number] in for a well woman gyn exam and pap. She works from home 2 days a week. PCP is Dr Ladona Ridgel.   Current Medications, Allergies, Past Medical History, Past Surgical History, Family History and Social History were reviewed in [employer] record.     Review of Systems:  Patient denies any headaches, hearing loss, fatigue, blurred vision, shortness of breath, chest pain, abdominal pain, problems with bowel movements, urination, or intercourse. No joint pain or mood swings. Has IUD, and no period Has had some hot flashes  +teary at times +weight gain   Physical Exam:BP 117/71 (BP Location: Right Arm, Patient Position: Sitting, Cuff Size: Large)   Pulse 90   Ht 5\' 4"  (1.626 m)   Wt 232 lb 3.2 oz (105.3 kg)   BMI 39.86 kg/m  General:  Well developed, well nourished, no acute distress Skin:  Warm and dry Neck:  Midline trachea, normal thyroid, good ROM, no lymphadenopathy Lungs; Clear to auscultation bilaterally Breast:  No dominant palpable mass, retraction, or nipple discharge Cardiovascular: Regular rate and rhythm Abdomen:  Soft, non tender, no hepatosplenomegaly Pelvic:  External genitalia is normal in appearance, no lesions.  The vagina is normal in appearance. Urethra has no lesions or masses. The cervix is bulbous, +IUD string at os, short.Pap with HR HPV genotyping performed.  Uterus is felt to be normal size, shape, and contour.  No adnexal masses or tenderness noted.Bladder is non tender, no masses felt. Rectal: Good sphincter tone, no polyps, or hemorrhoids felt.  Hemoccult negative. Extremities/musculoskeletal:  No swelling or varicosities noted, no clubbing or cyanosis Psych:  No mood changes, alert and cooperative,seems happy AA is 3 Fall risk is low PHQ 9 score is 7 and she is open to meds,  tried wellbutrin and then lexapro and stopped both GAD 7 score is 3  [facility] - 11/27/20 1511      Pregnancy Intention Screening   Does the patient want to become pregnant in the next year? No    Does the patient's partner want to become pregnant in the next year? No    Would the patient like to discuss contraceptive options today? No      Contraception Wrap Up   Current Method IUD or IUS    End Method IUD or IUS    Contraception Counseling Provided No         Examination chaperoned by 11/29/20 RN  Impression and Plan: 1. Encounter for gynecological examination with Papanicolaou smear of cervix Pap sent Physical in 1 year Pap in 3 if normal Check CBC,CMP,TSH and lipids  2. Encounter for screening fecal occult blood testing   3. IUD (intrauterine device) in place Placed 02/22/18   4. Depression, unspecified depression type Will try effexor for depression and also hot flashes  Meds ordered this encounter  Medications  . venlafaxine XR (EFFEXOR XR) 37.5 MG 24 hr capsule    Sig: Take 1 capsule (37.5 mg total) by mouth daily with breakfast.    Dispense:  30 capsule    Refill:  3    Order Specific Question:   Supervising Provider    Answer:   02/24/18 [[id number]]   Call me in about 4 weeks with follow up, can increase dose then if needed   5. Hot flashes  6. Screening mammogram  for breast cancer Call for mammogram appt  7. Screening for colorectal cancer Referred to Dr Karilyn Cota for screening colonoscopy

## 2020-11-28 ENCOUNTER — Encounter (INDEPENDENT_AMBULATORY_CARE_PROVIDER_SITE_OTHER): Payer: Self-pay | Admitting: *Deleted

## 2020-11-29 LAB — CYTOLOGY - PAP
Adequacy: ABSENT
Comment: NEGATIVE
Diagnosis: NEGATIVE
High risk HPV: NEGATIVE

## 2020-12-07 LAB — COMPREHENSIVE METABOLIC PANEL
ALT: 19 IU/L (ref 0–32)
AST: 14 IU/L (ref 0–40)
Albumin/Globulin Ratio: 2.4 — ABNORMAL HIGH (ref 1.2–2.2)
Albumin: 4.8 g/dL (ref 3.8–4.9)
Alkaline Phosphatase: 100 IU/L (ref 44–121)
BUN/Creatinine Ratio: 14 (ref 9–23)
BUN: 12 mg/dL (ref 6–24)
Bilirubin Total: 0.7 mg/dL (ref 0.0–1.2)
CO2: 25 mmol/L (ref 20–29)
Calcium: 9.5 mg/dL (ref 8.7–10.2)
Chloride: 99 mmol/L (ref 96–106)
Creatinine, Ser: 0.85 mg/dL (ref 0.57–1.00)
Globulin, Total: 2 g/dL (ref 1.5–4.5)
Glucose: 104 mg/dL — ABNORMAL HIGH (ref 65–99)
Potassium: 4.3 mmol/L (ref 3.5–5.2)
Sodium: 137 mmol/L (ref 134–144)
Total Protein: 6.8 g/dL (ref 6.0–8.5)
eGFR: 82 mL/min/{1.73_m2} (ref >59)

## 2020-12-07 LAB — LIPID PANEL
Chol/HDL Ratio: 4.3 ratio (ref 0.0–4.4)
Cholesterol, Total: 232 mg/dL — ABNORMAL HIGH (ref 100–199)
HDL: 54 mg/dL (ref >39)
LDL Chol Calc (NIH): 156 mg/dL — ABNORMAL HIGH (ref 0–99)
Triglycerides: 122 mg/dL (ref 0–149)
VLDL Cholesterol Cal: 22 mg/dL (ref 5–40)

## 2020-12-07 LAB — CBC
Hematocrit: 41.5 % (ref 34.0–46.6)
Hemoglobin: 14.2 g/dL (ref 11.1–15.9)
MCH: 29 pg (ref 26.6–33.0)
MCHC: 34.2 g/dL (ref 31.5–35.7)
MCV: 85 fL (ref 79–97)
Platelets: 327 10*3/uL (ref 150–450)
RBC: 4.89 x10E6/uL (ref 3.77–5.28)
RDW: 12.2 % (ref 11.7–15.4)
WBC: 5.3 10*3/uL (ref 3.4–10.8)

## 2020-12-07 LAB — TSH: TSH: 2.92 u[IU]/mL (ref 0.450–4.500)

## 2020-12-20 ENCOUNTER — Other Ambulatory Visit: Payer: Self-pay | Admitting: Adult Health

## 2021-03-03 ENCOUNTER — Other Ambulatory Visit: Payer: Self-pay

## 2021-03-03 ENCOUNTER — Ambulatory Visit
Admission: RE | Admit: 2021-03-03 | Discharge: 2021-03-03 | Disposition: A | Discharge status: Home / Self Care | Payer: 59 | Source: Ambulatory Visit | Attending: Family Medicine | Admitting: Family Medicine

## 2021-03-03 VITALS — BP 123/84 | HR 81 | Temp 98.8°F | Resp 20

## 2021-03-03 DIAGNOSIS — J069 Acute upper respiratory infection, unspecified: Secondary | ICD-10-CM | POA: Insufficient documentation

## 2021-03-03 DIAGNOSIS — N390 Urinary tract infection, site not specified: Secondary | ICD-10-CM | POA: Insufficient documentation

## 2021-03-03 LAB — POCT URINALYSIS DIP (MANUAL ENTRY)
Bilirubin, UA: NEGATIVE
Glucose, UA: NEGATIVE mg/dL
Ketones, POC UA: NEGATIVE mg/dL
Nitrite, UA: NEGATIVE
Protein Ur, POC: 100 mg/dL — AB
Spec Grav, UA: 1.025 (ref 1.010–1.025)
Urobilinogen, UA: 0.2 E.U./dL
pH, UA: 7 (ref 5.0–8.0)

## 2021-03-03 MED ORDER — FLUCONAZOLE 150 MG PO TABS: 150.0000 mg | ORAL_TABLET | Freq: Once | ORAL | 0 refills | Status: AC

## 2021-03-03 MED ORDER — DOXYCYCLINE HYCLATE 100 MG PO CAPS: 100.0000 mg | ORAL_CAPSULE | Freq: Two times a day (BID) | ORAL | 0 refills | Status: AC

## 2021-03-03 NOTE — ED Triage Notes (Signed)
Pt presents with left side ear pain and throat pain , also has urinary frequency and pressure for past couple of days

## 2021-03-03 NOTE — ED Provider Notes (Signed)
[facility]    CSN: [id number] Arrival date & time: 03/03/21  1250      History   Chief Complaint Chief Complaint  Patient presents with   Dysuria   Nasal Congestion   Otalgia    HPI Ashley Mcintosh is a 55 y.o. female.   HPI Patient presents today sore throat, sinus drainage without fever. No known sick contacts. Having ear pain today. Taking OTC medicaiton. She is also concern that she may have a urinary tract illness.    Past Medical History:  Diagnosis Date   Allergy    Impaired fasting glucose    PONV (postoperative nausea and vomiting)    nausea   Seasonal affective disorder (HCC)    Seasonal allergies     Patient Active Problem List   Diagnosis Date Noted   Encounter for screening fecal occult blood testing 11/27/2020   Encounter for gynecological examination with Papanicolaou smear of cervix 11/27/2020   Hot flashes 11/27/2020   Depression 11/27/2020   IUD (intrauterine device) in place 11/27/2020   Screening for diabetes mellitus 11/27/2020   Screening for colorectal cancer 11/27/2020   Screening mammogram for breast cancer 11/27/2020   Encounter for IUD insertion 02/24/2018   Morbid obesity (HCC) 12/09/2014   HNP (herniated nucleus pulposus), lumbar 08/04/2013   Low back pain 07/12/2013   Neuropathic pain 07/12/2013   Seasonal affective disorder (HCC) 04/16/2013   Impaired fasting glucose 04/16/2013    Past Surgical History:  Procedure Laterality Date   DILATION AND CURETTAGE OF UTERUS     EYE SURGERY     lasik   LIPOSUCTION     LUMBAR LAMINECTOMY/DECOMPRESSION MICRODISCECTOMY Left 08/04/2013   Procedure: LUMBAR LAMINECTOMY/DECOMPRESSION MICRODISCECTOMY 1 LEVEL;  Surgeon: Carmela Hurt, MD;  Location: [facility];  Service: Neurosurgery;  Laterality: Left;  Left L5S1 microdiskectomy   LUMBAR LAMINECTOMY/DECOMPRESSION MICRODISCECTOMY Left 07/30/2014   Procedure: Left Lumbar five-Sacral one microdiskectomy;  Surgeon: Coletta Memos,  MD;  Location: [facility];  Service: Neurosurgery;  Laterality: Left;  Left Lumbar five-Sacral one microdiskectomy    OB History     Gravida  3   Para  2   Term  2   Preterm      AB  1   Living  2      SAB  1   IAB      Ectopic      Multiple      Live Births  2            Home Medications    Prior to Admission medications   Medication Sig Start Date End Date Taking? Authorizing Provider  doxycycline (VIBRAMYCIN) 100 MG capsule Take 1 capsule (100 mg total) by mouth 2 (two) times daily for 7 days. 03/03/21 03/10/21 Yes Bing Neighbors, FNP  fluticasone (FLONASE) 50 MCG/ACT nasal spray Place 2 sprays into both nostrils daily. 08/16/20   Ladona Ridgel, Malena M, DO  hydrochlorothiazide (HYDRODIURIL) 25 MG tablet TAKE 1 TABLET IN THE MORNING AS NEEDED FOR FLUID RETENTION Patient taking differently: 12.5 mg. TAKE 1 TABLET IN THE MORNING AS NEEDED FOR FLUID RETENTION 01/21/18   Campbell Riches, NP  levonorgestrel (MIRENA) 20 MCG/24HR IUD 1 each by Intrauterine route once. Reported on 08/16/2015    [provider]  naproxen sodium (ALEVE) 220 MG tablet Take 440 mg by mouth as needed. Reported on 08/16/2015    [provider]  venlafaxine XR (EFFEXOR-XR) 37.5 MG 24 hr capsule TAKE 1  CAPSULE BY MOUTH DAILY WITH BREAKFAST. 12/20/20   Adline Potter, NP    Family History Family History  Problem Relation Age of Onset   Hypertension Mother    Diabetes Mother    Cancer Brother        leukemia   Cancer Paternal Aunt        breast   Stroke Paternal Grandfather    Diabetes Maternal Grandmother    Parkinson's disease Maternal Grandfather    Diabetes Father     Social History Social History   Tobacco Use   Smoking status: Never   Smokeless tobacco: Never  Vaping Use   Vaping Use: Never used  Substance Use Topics   Alcohol use: Yes    Alcohol/week: 0.0 standard drinks    Comment: weekly   Drug use: No     Allergies   Ciprofloxacin and  Penicillins   Review of Systems Review of Systems Pertinent negatives listed in HPI  Physical Exam Triage Vital Signs ED Triage Vitals [03/03/21 1337]  Enc Vitals Group     BP 123/84     Pulse Rate 81     Resp 20     Temp 98.8 F (37.1 C)     Temp src      SpO2 95 %     Weight      Height      Head Circumference      Peak Flow      Pain Score      Pain Loc      Pain Edu?      Excl. in GC?    No data found.  Updated Vital Signs BP 123/84   Pulse 81   Temp 98.8 F (37.1 C)   Resp 20   SpO2 95%   Visual Acuity Right Eye Distance:   Left Eye Distance:   Bilateral Distance:    Right Eye Near:   Left Eye Near:    Bilateral Near:     Physical Exam   UC Treatments / Results  Labs (all labs ordered are listed, but only abnormal results are displayed) Labs Reviewed  POCT URINALYSIS DIP (MANUAL ENTRY) - Abnormal; Notable for the following components:      Result Value   Clarity, UA cloudy (*)    Blood, UA moderate (*)    Protein Ur, POC =100 (*)    Leukocytes, UA Large (3+) (*)    All other components within normal limits  COVID-19, FLU A+B NAA   Narrative:    Performed at:  [address] [employer] [address] [place], [place]  [id number] Lab Director: Jolene Schimke MD, Phone:  [phone]  URINE CULTURE    EKG   Radiology No results found.  Procedures Procedures (including critical care time)  Medications Ordered in UC Medications - No data to display  Initial Impression / Assessment and Plan / UC Course  I have reviewed the triage vital signs and the nursing notes.  Pertinent labs & imaging results that were available during my care of the patient were reviewed by me and considered in my medical decision making (see chart for details).     Viral URI with cough, continue  OTC management. Acute UTI doxycyline as directed. Diflucan for prophylaxis against yeast. COVID/Flu test pending. Symptom management warranted only.  Manage fever with  Tylenol and ibuprofen.  Nasal symptoms with over-the-counter antihistamines recommended.  Treatment per discharge medications/discharge instructions.  Red flags/ER precautions given. The most current CDC isolation/quarantine  recommendation advised.   Final Clinical Impressions(s) / UC Diagnoses   Final diagnoses:  Acute UTI (urinary tract infection)  Viral URI with cough   Discharge Instructions   None    ED Prescriptions     Medication Sig Dispense Auth. Provider   doxycycline (VIBRAMYCIN) 100 MG capsule Take 1 capsule (100 mg total) by mouth 2 (two) times daily for 7 days. 14 capsule Bing Neighbors, FNP   fluconazole (DIFLUCAN) 150 MG tablet Take 1 tablet (150 mg total) by mouth once for 1 dose. Repeat if needed 2 tablet Bing Neighbors, FNP      PDMP not reviewed this encounter.   Bing Neighbors, [place] 03/04/21 [phone]

## 2021-03-04 ENCOUNTER — Other Ambulatory Visit: Payer: 59

## 2021-03-04 LAB — COVID-19, FLU A+B NAA
Influenza A, NAA: NOT DETECTED
Influenza B, NAA: NOT DETECTED
SARS-CoV-2, NAA: NOT DETECTED

## 2021-03-07 ENCOUNTER — Telehealth (HOSPITAL_COMMUNITY): Payer: Self-pay | Admitting: Emergency Medicine

## 2021-03-07 LAB — URINE CULTURE: Culture: >=100000 — AB

## 2021-03-12 ENCOUNTER — Inpatient Hospital Stay: Admission: RE | Admit: 2021-03-12 | Payer: 59 | Source: Ambulatory Visit

## 2021-03-20 ENCOUNTER — Other Ambulatory Visit: Payer: Self-pay

## 2021-03-20 ENCOUNTER — Ambulatory Visit
Admission: RE | Admit: 2021-03-20 | Discharge: 2021-03-20 | Disposition: A | Discharge status: Home / Self Care | Payer: 59 | Source: Ambulatory Visit | Attending: Adult Health | Admitting: Adult Health

## 2021-03-20 DIAGNOSIS — Z1231 Encounter for screening mammogram for malignant neoplasm of breast: Secondary | ICD-10-CM

## 2021-09-22 ENCOUNTER — Other Ambulatory Visit: Payer: Self-pay | Admitting: Adult Health

## 2022-02-26 ENCOUNTER — Other Ambulatory Visit: Payer: Self-pay | Admitting: Adult Health

## 2022-02-26 DIAGNOSIS — Z1231 Encounter for screening mammogram for malignant neoplasm of breast: Secondary | ICD-10-CM

## 2022-03-06 ENCOUNTER — Telehealth: Payer: Self-pay | Admitting: *Deleted

## 2022-03-06 ENCOUNTER — Encounter: Payer: Self-pay | Admitting: Adult Health

## 2022-03-06 ENCOUNTER — Ambulatory Visit (INDEPENDENT_AMBULATORY_CARE_PROVIDER_SITE_OTHER): Payer: 59 | Admitting: Adult Health

## 2022-03-06 VITALS — BP 111/67 | HR 82 | Ht 64.0 in | Wt 223.0 lb

## 2022-03-06 DIAGNOSIS — R232 Flushing: Secondary | ICD-10-CM

## 2022-03-06 DIAGNOSIS — Z975 Presence of (intrauterine) contraceptive device: Secondary | ICD-10-CM

## 2022-03-06 DIAGNOSIS — Z1211 Encounter for screening for malignant neoplasm of colon: Secondary | ICD-10-CM

## 2022-03-06 DIAGNOSIS — Z01419 Encounter for gynecological examination (general) (routine) without abnormal findings: Secondary | ICD-10-CM | POA: Diagnosis not present

## 2022-03-06 DIAGNOSIS — Z1212 Encounter for screening for malignant neoplasm of rectum: Secondary | ICD-10-CM

## 2022-03-06 LAB — HEMOCCULT GUIAC POC 1CARD (OFFICE): Fecal Occult Blood, POC: NEGATIVE

## 2022-03-06 MED ORDER — HYDROCHLOROTHIAZIDE 25 MG PO TABS: ORAL_TABLET | ORAL | 1 refills | Status: DC

## 2022-03-06 MED ORDER — FLUTICASONE PROPIONATE 50 MCG/ACT NA SUSP: 2.0000 | Freq: Every day | NASAL | 3 refills | Status: AC

## 2022-03-06 NOTE — Progress Notes (Signed)
Patient ID: Ashley Mcintosh, female   DOB: [birth year], 56 y.o.   MRN: [id number] History of Present Illness: Ashley Mcintosh is a 56 year old white female, married, PM in for well woman gyn exam. She is having some hot flashes. She is going to [employer] for weight loss.  PCP is Dr Gerda Diss.   Current Medications, Allergies, Past Medical History, Past Surgical History, Family History and Social History were reviewed in [employer] record.     Review of Systems:  Patient denies any headaches, hearing loss, fatigue, blurred vision, shortness of breath, chest pain, abdominal pain, problems with bowel movements, urination, or intercourse. No joint pain or mood swings.  +hot flashes   Physical Exam:BP 111/67 (BP Location: Left Arm, Patient Position: Sitting, Cuff Size: Normal)   Pulse 82   Ht 5\' 4"  (1.626 m)   Wt 223 lb (101.2 kg)   BMI 38.28 kg/m   General:  Well developed, well nourished, no acute distress Skin:  Warm and dry Neck:  Midline trachea, normal thyroid, good ROM, no lymphadenopathy Lungs; Clear to auscultation bilaterally Breast:  No dominant palpable mass, retraction, or nipple discharge Cardiovascular: Regular rate and rhythm Abdomen:  Soft, non tender, no hepatosplenomegaly Pelvic:  External genitalia is normal in appearance, no lesions.  The vagina is normal in appearance. Urethra has no lesions or masses. The cervix is bulbous. +IUD strings at os. Uterus is felt to be normal size, shape, and contour.  No adnexal masses or tenderness noted.Bladder is non tender, no masses felt. Rectal: Good sphincter tone, no polyps, or hemorrhoids felt.  Hemoccult negative. Extremities/musculoskeletal:  No swelling or varicosities noted, no clubbing or cyanosis Psych:  No mood changes, alert and cooperative,seems happy AA is 3 Fall risk is low    03/06/2022    8:44 AM 11/27/2020    3:11 PM 08/24/2019   11:17 AM  Depression screen PHQ 2/9  Decreased Interest 0 1 1   Down, Depressed, Hopeless 0 0 1  PHQ - 2 Score 0 1 2  Altered sleeping 2 2 2   Tired, decreased energy 1 2 1   Change in appetite 0 1 1  Feeling bad or failure about yourself  0 1 1  Trouble concentrating 0 0 1  Moving slowly or fidgety/restless 0 0 0  Suicidal thoughts 0 0 0  PHQ-9 Score 3 7 8   Difficult doing work/chores   Somewhat difficult       03/06/2022    8:44 AM 11/27/2020    3:11 PM 08/24/2019   11:20 AM  GAD 7 : Generalized Anxiety Score  Nervous, Anxious, on Edge 0 1 0  Control/stop worrying 0 0 1  Worry too much - different things 1 0 1  Trouble relaxing 1 1 1   Restless 0 0 0  Easily annoyed or irritable 0 1 2  Afraid - awful might happen 0 0 0  Total GAD 7 Score 2 3 5   Anxiety Difficulty   Somewhat difficult    [facility] - 03/06/22 0851       Pregnancy Intention Screening   Does the patient want to become pregnant in the next year? No    Does the patient's partner want to become pregnant in the next year? No    Would the patient like to discuss contraceptive options today? No      Contraception Wrap Up   Current Method IUD or IUS    End Method IUD or IUS  Examination chaperoned by Malachy Mood LPN  Impression and Plan: 1. Encounter for well woman exam with routine gynecological exam Physical in 1 year Pap in 2025 Had labs at [facility], A1c 5.3 Has mammogram scheduled for 03/25/22 Refilled hydrodiuril and Flonase at her request Meds ordered this encounter  Medications   fluticasone (FLONASE) 50 MCG/ACT nasal spray    Sig: Place 2 sprays into both nostrils daily.    Dispense:  48 g    Refill:  3    Order Specific Question:   Supervising Provider    Answer:   Despina Hidden, LUTHER H [[id number]]   hydrochlorothiazide (HYDRODIURIL) 25 MG tablet    Sig: TAKE 1 TABLET IN THE MORNING AS NEEDED FOR FLUID RETENTION    Dispense:  90 tablet    Refill:  1    Order Specific Question:   Supervising Provider    Answer:   Despina Hidden, LUTHER H [[id number]]    2. Encounter for  screening fecal occult blood testing Hemoccult negative  3. IUD (intrauterine device) in place Mirena placed 02/22/18  4. Hot flashes Will monitor for now  5. Screening for colorectal cancer Cologuard ordered

## 2022-03-06 NOTE — Telephone Encounter (Signed)
Pt is taking Magnesium 100 mg. I added to pt's med list. JSY

## 2022-03-25 ENCOUNTER — Ambulatory Visit: Payer: 59

## 2022-04-07 ENCOUNTER — Ambulatory Visit
Admission: RE | Admit: 2022-04-07 | Discharge: 2022-04-07 | Disposition: A | Discharge status: Home / Self Care | Payer: 59 | Source: Ambulatory Visit | Attending: Adult Health | Admitting: Adult Health

## 2022-04-07 DIAGNOSIS — Z1231 Encounter for screening mammogram for malignant neoplasm of breast: Secondary | ICD-10-CM

## 2022-04-09 ENCOUNTER — Other Ambulatory Visit: Payer: Self-pay | Admitting: Adult Health

## 2022-04-09 DIAGNOSIS — R928 Other abnormal and inconclusive findings on diagnostic imaging of breast: Secondary | ICD-10-CM

## 2022-04-22 ENCOUNTER — Ambulatory Visit
Admission: RE | Admit: 2022-04-22 | Discharge: 2022-04-22 | Disposition: A | Discharge status: Home / Self Care | Payer: 59 | Source: Ambulatory Visit | Attending: Adult Health | Admitting: Adult Health

## 2022-04-22 DIAGNOSIS — R928 Other abnormal and inconclusive findings on diagnostic imaging of breast: Secondary | ICD-10-CM

## 2022-04-24 LAB — COLOGUARD: COLOGUARD: NEGATIVE

## 2022-06-22 DIAGNOSIS — F338 Other recurrent depressive disorders: Secondary | ICD-10-CM | POA: Diagnosis not present

## 2022-06-22 DIAGNOSIS — R632 Polyphagia: Secondary | ICD-10-CM | POA: Diagnosis not present

## 2022-06-22 DIAGNOSIS — R600 Localized edema: Secondary | ICD-10-CM | POA: Diagnosis not present

## 2022-09-15 DIAGNOSIS — R600 Localized edema: Secondary | ICD-10-CM | POA: Diagnosis not present

## 2022-09-15 DIAGNOSIS — R7303 Prediabetes: Secondary | ICD-10-CM | POA: Diagnosis not present

## 2022-09-15 DIAGNOSIS — R632 Polyphagia: Secondary | ICD-10-CM | POA: Diagnosis not present

## 2022-09-15 DIAGNOSIS — Z6836 Body mass index (BMI) 36.0-36.9, adult: Secondary | ICD-10-CM | POA: Diagnosis not present

## 2022-11-20 ENCOUNTER — Ambulatory Visit (INDEPENDENT_AMBULATORY_CARE_PROVIDER_SITE_OTHER): Payer: BC Managed Care – PPO | Admitting: Nurse Practitioner

## 2022-11-20 ENCOUNTER — Encounter: Payer: Self-pay | Admitting: Nurse Practitioner

## 2022-11-20 VITALS — BP 137/92 | HR 95 | Ht 64.0 in | Wt 209.0 lb

## 2022-11-20 DIAGNOSIS — N912 Amenorrhea, unspecified: Secondary | ICD-10-CM

## 2022-11-20 DIAGNOSIS — R232 Flushing: Secondary | ICD-10-CM

## 2022-11-20 DIAGNOSIS — R61 Generalized hyperhidrosis: Secondary | ICD-10-CM

## 2022-11-20 DIAGNOSIS — R5383 Other fatigue: Secondary | ICD-10-CM | POA: Diagnosis not present

## 2022-11-20 DIAGNOSIS — N951 Menopausal and female climacteric states: Secondary | ICD-10-CM | POA: Diagnosis not present

## 2022-11-20 NOTE — Patient Instructions (Addendum)
Soy Flaxseed Black Cohosh  Managing Hot Flashes During Menopause You will learn what hot flashes/night sweats are, what causes them and what the treatment methods are. To view the content, go to this web address: https://pe.elsevier.com/[email or web address]  This video will expire on: 07/22/2024. If you need access to this video following this date, please reach out to the healthcare provider who assigned it to you. This information is not intended to replace advice given to you by your health care provider. Make sure you discuss any questions you have with your health care provider. Elsevier Patient Education  2023 [employer].

## 2022-11-20 NOTE — Progress Notes (Unsigned)
   Subjective:    Patient ID: Ashley Mcintosh, female    DOB: 1965/12/31, 57 y.o.   MRN: [id number]  HPI Patient arrives today having issues with hot flashes.  Currently on Mirena, does not have a cycle.  States the hot flashes began last summer.  Complaints of extreme feeling of heat with generalized significant sweating.  Having difficulty staying asleep especially due to hot flashes.  Has not tried any meds or OTC products.  Has been on phentermine and bupropion through another provider.  This may be a factor in the hot flashes but patient is unsure.  No pelvic pain.  Same sexual partner.  Gets regular gynecological exams.   Review of Systems  HENT:  Negative for sore throat and trouble swallowing.   Respiratory:  Negative for cough, chest tightness, shortness of breath and wheezing.   Cardiovascular:  Negative for chest pain, palpitations and leg swelling.      11/20/2022    3:32 PM  Depression screen PHQ 2/9  Decreased Interest 1  Down, Depressed, Hopeless 0  PHQ - 2 Score 1  Altered sleeping 3  Tired, decreased energy 1  Change in appetite 1  Feeling bad or failure about yourself  0  Trouble concentrating 1  Moving slowly or fidgety/restless 0  Suicidal thoughts 0  PHQ-9 Score 7  Difficult doing work/chores Somewhat difficult      11/20/2022    3:32 PM 03/06/2022    8:44 AM 11/27/2020    3:11 PM 08/24/2019   11:20 AM  GAD 7 : Generalized Anxiety Score  Nervous, Anxious, on Edge 0 0 1 0  Control/stop worrying 0 0 0 1  Worry too much - different things 0 1 0 1  Trouble relaxing 1 1 1 1   Restless 0 0 0 0  Easily annoyed or irritable 2 0 1 2  Afraid - awful might happen 0 0 0 0  Total GAD 7 Score 3 2 3 5   Anxiety Difficulty Not difficult at all   Somewhat difficult         Objective:   Physical Exam NAD.  Alert, oriented.  Calm cheerful affect.  Thyroid nontender to palpation, no mass or goiter noted.  Lungs clear.  Heart regular rate rhythm.  Lower extremities no  edema. Today's Vitals   11/20/22 1528  BP: (!) 137/92  Pulse: 95  SpO2: 98%  Weight: 209 lb (94.8 kg)  Height: 5\' 4"  (1.626 m)   Body mass index is 35.87 kg/m.      Assessment & Plan:   Problem List Items Addressed This Visit       Cardiovascular and Mediastinum   Hot flashes   Relevant Orders   Hormone Panel (Completed)     Musculoskeletal and Integument   Generalized hyperhidrosis - Primary   Other Visit Diagnoses     Amenorrhea       Relevant Orders   Estradiol (Completed)   Hormone Panel (Completed)      Estradiol and FSH levels ordered. Recommend soy, flaxseed and black cohosh as directed for natural relief of hot flashes. Discussed other medications that may help including anticholinergics. It is unclear at this point if the combination of bupropion and phentermine may be adding to the symptoms. Based on lab work and symptoms on natural supplements, consider medication. Patient agrees with this plan. Follow-up here as needed.

## 2022-11-22 ENCOUNTER — Encounter: Payer: Self-pay | Admitting: Nurse Practitioner

## 2022-11-22 DIAGNOSIS — R61 Generalized hyperhidrosis: Secondary | ICD-10-CM | POA: Insufficient documentation

## 2022-12-01 LAB — HORMONE PANEL (T4,TSH,FSH,TESTT,SHBG,DHEA,ETC)
DHEA-Sulfate, LCMS: 54 ug/dL
Estradiol, Serum, MS: 19 pg/mL
Estrone Sulfate: <10 ng/dL
Follicle Stimulating Hormone: 84 m[IU]/mL
Free T-3: 3.4 pg/mL
Free Testosterone, Serum: 1.1 pg/mL
Progesterone, Serum: <10 ng/dL
Sex Hormone Binding Globulin: 69.8 nmol/L
T4: 8.7 ug/dL
TSH: 3 uU/mL
Testosterone, Serum (Total): 12 ng/dL
Testosterone-% Free: 0.9 %
Triiodothyronine (T-3), Serum: 88 ng/dL

## 2022-12-01 LAB — ESTRADIOL: Estradiol: 26.8 pg/mL

## 2022-12-22 ENCOUNTER — Telehealth: Payer: Self-pay

## 2022-12-22 ENCOUNTER — Other Ambulatory Visit: Payer: Self-pay

## 2022-12-22 MED ORDER — BUPROPION HCL ER (SR) 150 MG PO TB12
ORAL_TABLET | ORAL | 3 refills | Status: DC
Start: 2022-12-22 — End: 2023-05-19

## 2022-12-22 NOTE — Telephone Encounter (Signed)
Prescription Request  12/22/2022  LOV: Visit date not found  What is the name of the medication or equipment? buPROPion (WELLBUTRIN SR) 150 MG 12 hr tablet   Have you contacted your pharmacy to request a refill? Yes   Which pharmacy would you like this sent to?  [facility] [address]   Please advise at Mobile [phone] (mobile)

## 2023-01-18 ENCOUNTER — Encounter: Payer: Self-pay | Admitting: Nurse Practitioner

## 2023-01-20 ENCOUNTER — Other Ambulatory Visit: Payer: Self-pay | Admitting: Nurse Practitioner

## 2023-01-20 MED ORDER — POTASSIUM CHLORIDE CRYS ER 20 MEQ PO TBCR: EXTENDED_RELEASE_TABLET | ORAL | 0 refills | Status: DC

## 2023-02-22 DIAGNOSIS — R051 Acute cough: Secondary | ICD-10-CM | POA: Diagnosis not present

## 2023-02-22 DIAGNOSIS — R030 Elevated blood-pressure reading, without diagnosis of hypertension: Secondary | ICD-10-CM | POA: Diagnosis not present

## 2023-02-22 DIAGNOSIS — B3731 Acute candidiasis of vulva and vagina: Secondary | ICD-10-CM | POA: Diagnosis not present

## 2023-02-22 DIAGNOSIS — J0140 Acute pansinusitis, unspecified: Secondary | ICD-10-CM | POA: Diagnosis not present

## 2023-02-23 ENCOUNTER — Ambulatory Visit: Payer: BC Managed Care – PPO | Admitting: Family Medicine

## 2023-05-19 ENCOUNTER — Other Ambulatory Visit: Payer: Self-pay | Admitting: Nurse Practitioner

## 2023-05-19 ENCOUNTER — Ambulatory Visit: Payer: BC Managed Care – PPO | Admitting: Family Medicine

## 2023-05-19 ENCOUNTER — Encounter: Payer: Self-pay | Admitting: Nurse Practitioner

## 2023-05-19 ENCOUNTER — Ambulatory Visit: Payer: BC Managed Care – PPO | Admitting: Nurse Practitioner

## 2023-05-19 VITALS — Ht 64.0 in | Wt 231.8 lb

## 2023-05-19 VITALS — BP 158/90 | HR 71 | Ht 64.0 in | Wt 231.0 lb

## 2023-05-19 DIAGNOSIS — Z13228 Encounter for screening for other metabolic disorders: Secondary | ICD-10-CM

## 2023-05-19 DIAGNOSIS — R600 Localized edema: Secondary | ICD-10-CM | POA: Diagnosis not present

## 2023-05-19 DIAGNOSIS — Z0000 Encounter for general adult medical examination without abnormal findings: Secondary | ICD-10-CM

## 2023-05-19 DIAGNOSIS — R7301 Impaired fasting glucose: Secondary | ICD-10-CM | POA: Diagnosis not present

## 2023-05-19 DIAGNOSIS — Z23 Encounter for immunization: Secondary | ICD-10-CM

## 2023-05-19 DIAGNOSIS — F338 Other recurrent depressive disorders: Secondary | ICD-10-CM

## 2023-05-19 DIAGNOSIS — Z13220 Encounter for screening for lipoid disorders: Secondary | ICD-10-CM

## 2023-05-19 DIAGNOSIS — F32A Depression, unspecified: Secondary | ICD-10-CM

## 2023-05-19 DIAGNOSIS — Z130 Encounter for screening for diseases of the blood and blood-forming organs and certain disorders involving the immune mechanism: Secondary | ICD-10-CM

## 2023-05-19 DIAGNOSIS — Z1329 Encounter for screening for other suspected endocrine disorder: Secondary | ICD-10-CM

## 2023-05-19 MED ORDER — BUPROPION HCL ER (XL) 150 MG PO TB24: 150.0000 mg | ORAL_TABLET | Freq: Every day | ORAL | 1 refills | Status: DC

## 2023-05-19 MED ORDER — CHLORTHALIDONE 25 MG PO TABS: ORAL_TABLET | ORAL | 0 refills | Status: DC

## 2023-05-19 NOTE — Progress Notes (Signed)
Subjective:    Patient ID: Ashley Mcintosh, female    DOB: 11-25-1965, 57 y.o.   MRN: [id number]  HPI Presents for c/o swelling in the ankles and feet. Hydrochlorothiazide does not seem to be helping. No added salt to her diet but some sodium in her foods. Not taking her potassium at this point. Has a mostly sedentary job requiring sitting. Has had some weight gain. Struggling with mild depression. History of seasonal affective disorder. Her Buproprion was switched at some point to SR vs XL. Patient was unaware of this.       05/19/2023    9:16 AM  Depression screen PHQ 2/9  Decreased Interest 0  Down, Depressed, Hopeless 0  PHQ - 2 Score 0  Altered sleeping 1  Tired, decreased energy 1  Change in appetite 1  Feeling bad or failure about yourself  0  Trouble concentrating 1  Moving slowly or fidgety/restless 0  Suicidal thoughts 0  PHQ-9 Score 4  Difficult doing work/chores Not difficult at all       05/19/2023    9:17 AM 11/20/2022    3:32 PM 03/06/2022    8:44 AM 11/27/2020    3:11 PM  GAD 7 : Generalized Anxiety Score  Nervous, Anxious, on Edge 0 0 0 1  Control/stop worrying 0 0 0 0  Worry too much - different things 0 0 1 0  Trouble relaxing 0 1 1 1   Restless 0 0 0 0  Easily annoyed or irritable 0 2 0 1  Afraid - awful might happen 0 0 0 0  Total GAD 7 Score 0 3 2 3   Anxiety Difficulty Not difficult at all Not difficult at all         Review of Systems  Constitutional:  Positive for fatigue.  Respiratory:  Negative for cough, chest tightness and shortness of breath.        No orthopnea  Cardiovascular:  Positive for leg swelling. Negative for chest pain.       Objective:   Physical Exam NAD. Alert, oriented. Tearful at times. Lungs clear. Heart RRR. Trace pitting edema lower extremities bilaterally. Strong DP pulses bilat. Mild varicose veins noted.  Today's Vitals   05/19/23 0925 05/19/23 1003  BP: (!) 144/92 (!) 158/90  Pulse: 71   SpO2: 98%   Weight:  231 lb (104.8 kg)   Height: 5\' 4"  (1.626 m)    Body mass index is 39.65 kg/m.        Assessment & Plan:   Problem List Items Addressed This Visit       Endocrine   Impaired fasting glucose   Relevant Orders   Comprehensive metabolic panel     Other   Seasonal affective disorder (HCC) (Chronic)   Relevant Medications   buPROPion (WELLBUTRIN XL) 150 MG 24 hr tablet   Depression   Relevant Medications   buPROPion (WELLBUTRIN XL) 150 MG 24 hr tablet   Peripheral edema - Primary   Other Visit Diagnoses     Needs flu shot       Relevant Orders   Flu vaccine trivalent PF, 6mos and older(Flulaval,Afluria,Fluarix,Fluzone) (Completed)   Annual physical exam       Relevant Orders   CBC with Differential/Platelet   Comprehensive metabolic panel   Lipid panel   TSH   Screening for deficiency anemia       Relevant Orders   CBC with Differential/Platelet   Screening for metabolic disorder  Relevant Orders   Comprehensive metabolic panel   Screening for lipid disorders       Relevant Orders   Lipid panel   Screening for thyroid disorder       Relevant Orders   TSH      Meds ordered this encounter  Medications   chlorthalidone (HYGROTON) 25 MG tablet    Sig: Take one tab po qam prn swelling    Dispense:  90 tablet    Refill:  0    Order Specific Question:   Supervising Provider    Answer:   Lilyan Punt A [[id number]]   buPROPion (WELLBUTRIN XL) 150 MG 24 hr tablet    Sig: Take 1 tablet (150 mg total) by mouth daily.    Dispense:  90 tablet    Refill:  1    Order Specific Question:   Supervising Provider    Answer:   Lilyan Punt A [[id number]]   Switch Buproprion back to XL dosing to see if this will improve her depression symptoms. Stop hydrochlorothiazide. Switch to chlorthalidone to see if this will help edema. Discussed importance of low sodium diet and weight loss. Also recommend compression stockings.  Labs ordered for annual exam. Hold on potassium until labs  are done.  Follow up at physical. Call back sooner if needed.

## 2023-05-21 DIAGNOSIS — Z13220 Encounter for screening for lipoid disorders: Secondary | ICD-10-CM | POA: Diagnosis not present

## 2023-05-21 DIAGNOSIS — Z0000 Encounter for general adult medical examination without abnormal findings: Secondary | ICD-10-CM | POA: Diagnosis not present

## 2023-05-21 DIAGNOSIS — R7301 Impaired fasting glucose: Secondary | ICD-10-CM | POA: Diagnosis not present

## 2023-05-21 DIAGNOSIS — Z13228 Encounter for screening for other metabolic disorders: Secondary | ICD-10-CM | POA: Diagnosis not present

## 2023-05-21 DIAGNOSIS — Z130 Encounter for screening for diseases of the blood and blood-forming organs and certain disorders involving the immune mechanism: Secondary | ICD-10-CM | POA: Diagnosis not present

## 2023-05-22 LAB — LIPID PANEL
Chol/HDL Ratio: 4 {ratio} (ref 0.0–4.4)
Cholesterol, Total: 226 mg/dL — ABNORMAL HIGH (ref 100–199)
HDL: 57 mg/dL (ref >39)
LDL Chol Calc (NIH): 150 mg/dL — ABNORMAL HIGH (ref 0–99)
Triglycerides: 109 mg/dL (ref 0–149)
VLDL Cholesterol Cal: 19 mg/dL (ref 5–40)

## 2023-05-22 LAB — CBC WITH DIFFERENTIAL/PLATELET
Basophils Absolute: 0.1 10*3/uL (ref 0.0–0.2)
Basos: 1 %
EOS (ABSOLUTE): 0.1 10*3/uL (ref 0.0–0.4)
Eos: 2 %
Hematocrit: 43.2 % (ref 34.0–46.6)
Hemoglobin: 14.2 g/dL (ref 11.1–15.9)
Immature Grans (Abs): 0 10*3/uL (ref 0.0–0.1)
Immature Granulocytes: 0 %
Lymphocytes Absolute: 1.9 10*3/uL (ref 0.7–3.1)
Lymphs: 39 %
MCH: 29.1 pg (ref 26.6–33.0)
MCHC: 32.9 g/dL (ref 31.5–35.7)
MCV: 89 fL (ref 79–97)
Monocytes Absolute: 0.5 10*3/uL (ref 0.1–0.9)
Monocytes: 9 %
Neutrophils Absolute: 2.5 10*3/uL (ref 1.4–7.0)
Neutrophils: 49 %
Platelets: 301 10*3/uL (ref 150–450)
RBC: 4.88 x10E6/uL (ref 3.77–5.28)
RDW: 12.1 % (ref 11.7–15.4)
WBC: 5 10*3/uL (ref 3.4–10.8)

## 2023-05-22 LAB — COMPREHENSIVE METABOLIC PANEL
ALT: 19 [IU]/L (ref 0–32)
AST: 16 [IU]/L (ref 0–40)
Albumin: 4.8 g/dL (ref 3.8–4.9)
Alkaline Phosphatase: 101 [IU]/L (ref 44–121)
BUN/Creatinine Ratio: 17 (ref 9–23)
BUN: 16 mg/dL (ref 6–24)
Bilirubin Total: 0.5 mg/dL (ref 0.0–1.2)
CO2: 24 mmol/L (ref 20–29)
Calcium: 9.7 mg/dL (ref 8.7–10.2)
Chloride: 99 mmol/L (ref 96–106)
Creatinine, Ser: 0.93 mg/dL (ref 0.57–1.00)
Globulin, Total: 2.2 g/dL (ref 1.5–4.5)
Glucose: 112 mg/dL — ABNORMAL HIGH (ref 70–99)
Potassium: 4.5 mmol/L (ref 3.5–5.2)
Sodium: 138 mmol/L (ref 134–144)
Total Protein: 7 g/dL (ref 6.0–8.5)
eGFR: 73 mL/min/{1.73_m2} (ref >59)

## 2023-05-22 LAB — TSH: TSH: 3.76 u[IU]/mL (ref 0.450–4.500)

## 2023-05-23 NOTE — Progress Notes (Signed)
Seen by Eber Jones. Not seen by me.  Everlene Other DO [facility]

## 2023-08-31 ENCOUNTER — Other Ambulatory Visit: Payer: Self-pay

## 2023-08-31 ENCOUNTER — Telehealth: Payer: Self-pay

## 2023-08-31 MED ORDER — CHLORTHALIDONE 25 MG PO TABS: ORAL_TABLET | ORAL | 0 refills | Status: DC

## 2023-08-31 NOTE — Telephone Encounter (Signed)
Prescription Request  08/31/2023  LOV: Visit date not found  What is the name of the medication or equipment? chlorthalidone (HYGROTON) 25 MG tablet   Have you contacted your pharmacy to request a refill? Yes   Which pharmacy would you like this sent to?  [facility]   Patient notified that their request is being sent to the clinical staff for review and that they should receive a response within 2 business days.   Please advise at Mobile [phone] (mobile)

## 2023-11-11 ENCOUNTER — Other Ambulatory Visit: Payer: Self-pay | Admitting: Family Medicine

## 2023-11-11 NOTE — Telephone Encounter (Signed)
 Copied from CRM [id number]. Topic: Clinical - Medication Refill >> Nov 11, 2023 11:46 AM Higinio Roger wrote: Most Recent Primary Care Visit:  Provider: Campbell Riches  Department: [facility]  Visit Type: OFFICE VISIT  Date: 05/19/2023  Medication: buPROPion (WELLBUTRIN XL) 150 MG 24 hr tablet   Has the patient contacted their pharmacy? Yes (Agent: If no, request that the patient contact the pharmacy for the refill. If patient does not wish to contact the pharmacy document the reason why and proceed with request.) (Agent: If yes, when and what did the pharmacy advise?)  Is this the correct pharmacy for this prescription? Yes If no, delete pharmacy and type the correct one.  This is the patient's preferred pharmacy:   [id number] - [place], [place] - [address] [place] [address] Phone: [phone] Fax: [phone]  Has the prescription been filled recently? Yes  Is the patient out of the medication? Yes  Has the patient been seen for an appointment in the last year OR does the patient have an upcoming appointment? Yes  Can we respond through MyChart? Yes  Agent: Please be advised that Rx refills may take up to 3 business days. We ask that you follow-up with your pharmacy.

## 2023-11-12 MED ORDER — BUPROPION HCL ER (XL) 150 MG PO TB24: 150.0000 mg | ORAL_TABLET | Freq: Every day | ORAL | 1 refills | Status: DC

## 2023-12-02 ENCOUNTER — Ambulatory Visit (INDEPENDENT_AMBULATORY_CARE_PROVIDER_SITE_OTHER): Payer: Self-pay | Admitting: Nurse Practitioner

## 2023-12-02 ENCOUNTER — Other Ambulatory Visit: Payer: Self-pay | Admitting: Family Medicine

## 2023-12-02 VITALS — BP 131/84 | HR 84 | Temp 98.6°F | Ht 64.0 in | Wt 224.0 lb

## 2023-12-02 DIAGNOSIS — E785 Hyperlipidemia, unspecified: Secondary | ICD-10-CM | POA: Diagnosis not present

## 2023-12-02 DIAGNOSIS — Z0001 Encounter for general adult medical examination with abnormal findings: Secondary | ICD-10-CM | POA: Diagnosis not present

## 2023-12-02 DIAGNOSIS — E669 Obesity, unspecified: Secondary | ICD-10-CM

## 2023-12-02 DIAGNOSIS — R7301 Impaired fasting glucose: Secondary | ICD-10-CM

## 2023-12-02 DIAGNOSIS — Z6838 Body mass index (BMI) 38.0-38.9, adult: Secondary | ICD-10-CM

## 2023-12-02 DIAGNOSIS — N912 Amenorrhea, unspecified: Secondary | ICD-10-CM

## 2023-12-02 DIAGNOSIS — Z79899 Other long term (current) drug therapy: Secondary | ICD-10-CM

## 2023-12-02 DIAGNOSIS — Z0000 Encounter for general adult medical examination without abnormal findings: Secondary | ICD-10-CM

## 2023-12-02 DIAGNOSIS — Z1231 Encounter for screening mammogram for malignant neoplasm of breast: Secondary | ICD-10-CM

## 2023-12-02 MED ORDER — PHENTERMINE HCL 37.5 MG PO TABS: ORAL_TABLET | ORAL | 2 refills | Status: DC

## 2023-12-02 NOTE — Progress Notes (Unsigned)
   Subjective:    Patient ID: Ashley Mcintosh, female    DOB: Sep 21, 1965, 58 y.o.   MRN: [id number]  HPI The patient comes in today for a wellness visit.    A review of their health history was completed.  A review of medications was also completed.  Any needed refills; Unsure  Eating habits: Healthy food but feel as if overeating  Falls/  MVA accidents in past few months: no  Regular exercise: Walking 3-4 days a week, just added some weights  Specialist pt sees on regular basis: No  Preventative health issues were discussed.   Additional concerns: Discuss medication taken in past.   Review of Systems     Objective:   Physical Exam        Assessment & Plan:

## 2023-12-03 ENCOUNTER — Other Ambulatory Visit: Payer: Self-pay

## 2023-12-03 ENCOUNTER — Encounter: Payer: Self-pay | Admitting: Nurse Practitioner

## 2023-12-03 DIAGNOSIS — E669 Obesity, unspecified: Secondary | ICD-10-CM | POA: Insufficient documentation

## 2023-12-03 LAB — COMPREHENSIVE METABOLIC PANEL WITH GFR
ALT: 34 IU/L — ABNORMAL HIGH (ref 0–32)
AST: 27 IU/L (ref 0–40)
Albumin: 4.9 g/dL (ref 3.8–4.9)
Alkaline Phosphatase: 108 IU/L (ref 44–121)
BUN/Creatinine Ratio: 13 (ref 9–23)
BUN: 10 mg/dL (ref 6–24)
Bilirubin Total: 0.6 mg/dL (ref 0.0–1.2)
CO2: 25 mmol/L (ref 20–29)
Calcium: 9.8 mg/dL (ref 8.7–10.2)
Chloride: 96 mmol/L (ref 96–106)
Creatinine, Ser: 0.77 mg/dL (ref 0.57–1.00)
Globulin, Total: 2.2 g/dL (ref 1.5–4.5)
Glucose: 101 mg/dL — ABNORMAL HIGH (ref 70–99)
Potassium: 4.1 mmol/L (ref 3.5–5.2)
Sodium: 140 mmol/L (ref 134–144)
Total Protein: 7.1 g/dL (ref 6.0–8.5)
eGFR: 91 mL/min/{1.73_m2} (ref >59)

## 2023-12-03 LAB — LIPID PANEL
Chol/HDL Ratio: 3.9 ratio (ref 0.0–4.4)
Cholesterol, Total: 232 mg/dL — ABNORMAL HIGH (ref 100–199)
HDL: 60 mg/dL (ref >39)
LDL Chol Calc (NIH): 151 mg/dL — ABNORMAL HIGH (ref 0–99)
Triglycerides: 119 mg/dL (ref 0–149)
VLDL Cholesterol Cal: 21 mg/dL (ref 5–40)

## 2023-12-03 LAB — HEMOGLOBIN A1C
Est. average glucose Bld gHb Est-mCnc: 117 mg/dL
Hgb A1c MFr Bld: 5.7 % — ABNORMAL HIGH (ref 4.8–5.6)

## 2023-12-03 LAB — FOLLICLE STIMULATING HORMONE: FSH: 67.4 m[IU]/mL

## 2023-12-03 MED ORDER — CHLORTHALIDONE 25 MG PO TABS: ORAL_TABLET | ORAL | 1 refills | Status: DC

## 2024-01-04 ENCOUNTER — Encounter: Payer: Self-pay | Admitting: Nurse Practitioner

## 2024-01-04 NOTE — Telephone Encounter (Signed)
 Patient is going to check with insurance concerning coverage with insurance for weight loss injectables - Wegovy or Zepbound (patient does not meet criteria for [facility])

## 2024-03-09 ENCOUNTER — Encounter: Payer: Self-pay | Admitting: Nurse Practitioner

## 2024-03-09 ENCOUNTER — Ambulatory Visit: Admitting: Nurse Practitioner

## 2024-03-09 VITALS — BP 136/84 | HR 84 | Temp 97.5°F | Ht 64.0 in | Wt 219.0 lb

## 2024-03-09 DIAGNOSIS — R7301 Impaired fasting glucose: Secondary | ICD-10-CM

## 2024-03-09 DIAGNOSIS — F32A Depression, unspecified: Secondary | ICD-10-CM

## 2024-03-09 DIAGNOSIS — R600 Localized edema: Secondary | ICD-10-CM

## 2024-03-09 DIAGNOSIS — E669 Obesity, unspecified: Secondary | ICD-10-CM | POA: Diagnosis not present

## 2024-03-10 ENCOUNTER — Encounter: Payer: Self-pay | Admitting: Nurse Practitioner

## 2024-03-10 NOTE — Progress Notes (Signed)
 Subjective:    Patient ID: Ashley Mcintosh, female    DOB: [birth year], 58 y.o.   MRN: [id number]  HPI Presents for recheck of depression and weight. Has stopped the Phentermine  since no significant weight loss. Currently on compounded Tirzepatide through an online program. On the starter dose with no adverse effects. Noticed the Phentermine  did help her concentration. Has always had some issues but struggling lately with distractibility and remembering things especially at work. Has not been screened for ADD. Taking her Chlorthalidone  daily for her edema which does help. Stopped taking her potassium since no further leg cramps. Does not check BP outside of the office.   Overall doing well on Bupropion  for depression.   Review of Systems  Constitutional:  Positive for fatigue.  Respiratory:  Negative for cough, chest tightness and shortness of breath.   Cardiovascular:  Positive for leg swelling. Negative for chest pain.  Gastrointestinal:  Negative for abdominal pain, constipation, diarrhea, nausea and vomiting.  Psychiatric/Behavioral:  Negative for suicidal ideas.       03/09/2024    2:22 PM  Depression screen PHQ 2/9  Decreased Interest 0  Down, Depressed, Hopeless 0  PHQ - 2 Score 0  Altered sleeping 1  Tired, decreased energy 2  Change in appetite 2  Feeling bad or failure about yourself  1  Trouble concentrating 2  Moving slowly or fidgety/restless 0  Suicidal thoughts 0  PHQ-9 Score 8  Difficult doing work/chores Not difficult at all      03/09/2024    2:22 PM 12/02/2023   12:59 PM 05/19/2023    9:17 AM 11/20/2022    3:32 PM  GAD 7 : Generalized Anxiety Score  Nervous, Anxious, on Edge 0 0 0 0  Control/stop worrying 0 0 0 0  Worry too much - different things 0 0 0 0  Trouble relaxing 1 0 0 1  Restless 0 0 0 0  Easily annoyed or irritable 1 0 0 2  Afraid - awful might happen 0 0 0 0  Total GAD 7 Score 2 0 0 3  Anxiety Difficulty Not difficult at all  Not difficult  at all Not difficult at all   Social History   Tobacco Use   Smoking status: Never   Smokeless tobacco: Never  Vaping Use   Vaping status: Never Used  Substance Use Topics   Alcohol use: Yes    Alcohol/week: 0.0 standard drinks of alcohol    Comment: weekly   Drug use: No        Objective:   Physical Exam Vitals and nursing note reviewed.  Constitutional:      General: She is not in acute distress. Cardiovascular:     Rate and Rhythm: Normal rate and regular rhythm.     Heart sounds: Normal heart sounds.  Pulmonary:     Effort: Pulmonary effort is normal.     Breath sounds: Normal breath sounds.  Musculoskeletal:     Right lower leg: No edema.     Left lower leg: No edema.  Neurological:     Mental Status: She is alert and oriented to person, place, and time.  Psychiatric:        Mood and Affect: Mood normal.        Behavior: Behavior normal.        Thought Content: Thought content normal.        Judgment: Judgment normal.    Today's Vitals   03/09/24 1411 03/09/24 1439  BP: (!) 142/87 136/84  Pulse: 84   Temp: (!) 97.5 F (36.4 C)   SpO2: 97%   Weight: 219 lb (99.3 kg)   Height: 5' 4 (1.626 m)    Body mass index is 37.59 kg/m.        Assessment & Plan:   Problem List Items Addressed This Visit       Endocrine   Impaired fasting glucose     Other   Depression   Obesity (BMI 30-39.9)   Peripheral edema - Primary   Continue current medication regimen as directed.  Caution advised regarding compounded medication and potential side effects of GLP-1.  Given ASRS screening tool to complete for next visit, sooner if she wishes to discuss further. Recommend holding off on treatment if needed until she gets on stable dose of Tirzepatide. She agrees with this plan.  Return in about 6 months (around 09/09/2024).

## 2024-03-30 ENCOUNTER — Other Ambulatory Visit: Payer: Self-pay

## 2024-03-30 ENCOUNTER — Encounter: Payer: Self-pay | Admitting: Nurse Practitioner

## 2024-03-30 MED ORDER — CHLORTHALIDONE 25 MG PO TABS: ORAL_TABLET | ORAL | 1 refills | Status: DC

## 2024-03-31 ENCOUNTER — Ambulatory Visit: Admitting: Family Medicine

## 2024-03-31 ENCOUNTER — Ambulatory Visit: Payer: Self-pay

## 2024-03-31 VITALS — BP 117/78 | HR 83 | Temp 98.1°F | Ht 64.0 in | Wt 223.2 lb

## 2024-03-31 DIAGNOSIS — J019 Acute sinusitis, unspecified: Secondary | ICD-10-CM

## 2024-03-31 MED ORDER — FLUCONAZOLE 150 MG PO TABS: 150.0000 mg | ORAL_TABLET | Freq: Once | ORAL | 0 refills | Status: AC

## 2024-03-31 MED ORDER — CEFDINIR 300 MG PO CAPS: 300.0000 mg | ORAL_CAPSULE | Freq: Two times a day (BID) | ORAL | 0 refills | Status: AC

## 2024-03-31 NOTE — Telephone Encounter (Addendum)
 Opening became available with Dr Alphonsa at 1:30pm, called patient back. She states she had just pulled up to urgent care but would prefer to be seen with Dr Alphonsa. Scheduled patient for 1:30pm with Dr Alphonsa.   FYI Only or Action Required?: FYI only for provider.  Patient was last seen in primary care on 03/09/2024 by Mauro Elveria BROCKS, NP.  Called Nurse Triage reporting Sinusitis.  Symptoms began several days ago.  Interventions attempted: OTC medications: Nyquil and Prescription medications: Z-pack (leftover from her husband).  Symptoms are: sore throat, sinus pressure/congestion, bilateral earaches, productive cough, gradually worsening and body/joint aches (gradually improving).  Triage Disposition: See Physician Within 24 Hours  Patient/caregiver understands and will follow disposition?: Yes                Message from [facility] sent at 03/31/2024  8:13 AM EDT  Summary: head, sinus, ear pressure, congestion, coughing   Patient calling, head, sinus, ear pressure, congestion, coughing up mucus  wanting to schedule appt         Reason for Disposition  Earache  Answer Assessment - Initial Assessment Questions 1. LOCATION: Where does it hurt?      Across cheeks, both ears, teeth.  2. ONSET: When did the sinus pain start?  (e.g., hours, days)      She states she started to feel bad on Tuesday.  3. SEVERITY: How bad is the pain?   (Scale 0-10; or none, mild, moderate or severe)     Pressure, 6-7/10.  4. RECURRENT SYMPTOM: Have you ever had sinus problems before? If Yes, ask: When was the last time? and What happened that time?      Yes. She states she normally gets a sinus infection twice a year.   5. NASAL CONGESTION: Is the nose blocked? If Yes, ask: Can you open it or must you breathe through your mouth?     Yes, she states it is usually one side or the other.  6. NASAL DISCHARGE: Do you have discharge from your nose? If so ask, What  color?     Yes, she states she has not looked and unsure what color it is.  7. FEVER: Do you have a fever? If Yes, ask: What is it, how was it measured, and when did it start?      No.  8. OTHER SYMPTOMS: Do you have any other symptoms? (e.g., sore throat, cough, earache, difficulty breathing)     Body/joint aches (improving), productive cough, sore throat. Denies any difficulty breathing.  9. PREGNANCY: Is there any chance you are pregnant? When was your last menstrual period?     N/A.  Protocols used: Sinus Pain or Congestion-A-AH

## 2024-03-31 NOTE — Progress Notes (Signed)
   Subjective:    Patient ID: Ashley Mcintosh, female    DOB: 12-27-1965, 58 y.o.   MRN: [id number]  HPI Patient had the onset of severe sore throat after spending a few days in the hospital with her sister who had emergency surgery Patient relates then she started having coughing body aches head congestion now has sinus pressure and pain and discomfort in her maxillary sinuses denies wheezing or difficulty breathing denies high fever chills denies shortness of breath PMH benign  Review of Systems     Objective:   Physical Exam  Moderate maxillary sinus tenderness bilateral eardrums are normal throat is normal neck no masses lungs clear      Assessment & Plan:  More than likely this began as a viral illness Then triggered a sinus infection Reasonable to treat with Omnicef  twice daily for 10 days Patient request Diflucan  Patient did not do COVID test but at this point it does not seem consequential Warning signs were discussed regarding pneumonia or worsening illness Patient to stay away from other family members through the weekend Follow-up if any ongoing troubles

## 2024-05-24 ENCOUNTER — Other Ambulatory Visit: Payer: Self-pay | Admitting: Nurse Practitioner

## 2024-05-24 DIAGNOSIS — Z1231 Encounter for screening mammogram for malignant neoplasm of breast: Secondary | ICD-10-CM

## 2024-06-07 ENCOUNTER — Ambulatory Visit
Admission: RE | Admit: 2024-06-07 | Discharge: 2024-06-07 | Disposition: A | Source: Ambulatory Visit | Attending: Nurse Practitioner | Admitting: Nurse Practitioner

## 2024-06-07 DIAGNOSIS — Z1231 Encounter for screening mammogram for malignant neoplasm of breast: Secondary | ICD-10-CM | POA: Diagnosis not present

## 2024-06-20 ENCOUNTER — Other Ambulatory Visit: Payer: Self-pay | Admitting: Family Medicine

## 2024-07-03 ENCOUNTER — Encounter: Payer: Self-pay | Admitting: Nurse Practitioner

## 2024-07-18 ENCOUNTER — Encounter: Payer: Self-pay | Admitting: Nurse Practitioner

## 2024-07-18 ENCOUNTER — Ambulatory Visit: Admitting: Nurse Practitioner

## 2024-07-18 VITALS — BP 143/84 | HR 87 | Temp 98.6°F | Ht 64.0 in | Wt 210.4 lb

## 2024-07-18 DIAGNOSIS — F4323 Adjustment disorder with mixed anxiety and depressed mood: Secondary | ICD-10-CM | POA: Insufficient documentation

## 2024-07-18 MED ORDER — ESCITALOPRAM OXALATE 10 MG PO TABS: 10.0000 mg | ORAL_TABLET | Freq: Every day | ORAL | 0 refills | Status: DC

## 2024-07-18 NOTE — Progress Notes (Unsigned)
   Subjective:    Patient ID: Ashley Mcintosh, female    DOB: [birth year], 58 y.o.   MRN: [id number]  HPI    Review of Systems      Objective:   Physical Exam        Assessment & Plan:

## 2024-07-22 ENCOUNTER — Encounter: Payer: Self-pay | Admitting: Nurse Practitioner

## 2024-08-15 ENCOUNTER — Other Ambulatory Visit: Payer: Self-pay

## 2024-08-15 DIAGNOSIS — F4323 Adjustment disorder with mixed anxiety and depressed mood: Secondary | ICD-10-CM

## 2024-08-15 MED ORDER — ESCITALOPRAM OXALATE 10 MG PO TABS: 10.0000 mg | ORAL_TABLET | Freq: Every day | ORAL | 0 refills | Status: DC

## 2024-08-15 NOTE — Telephone Encounter (Signed)
 Called patient to let know refill was sent in, she has no other questins

## 2024-08-24 ENCOUNTER — Ambulatory Visit: Admitting: Nurse Practitioner

## 2024-08-25 ENCOUNTER — Ambulatory Visit: Admitting: Nurse Practitioner

## 2024-08-25 ENCOUNTER — Encounter: Payer: Self-pay | Admitting: Nurse Practitioner

## 2024-08-25 VITALS — BP 112/76 | HR 78 | Temp 97.8°F | Ht 64.0 in | Wt 207.5 lb

## 2024-08-25 DIAGNOSIS — F338 Other recurrent depressive disorders: Secondary | ICD-10-CM

## 2024-08-25 DIAGNOSIS — F4323 Adjustment disorder with mixed anxiety and depressed mood: Secondary | ICD-10-CM

## 2024-08-25 MED ORDER — BUPROPION HCL ER (XL) 300 MG PO TB24: 300.0000 mg | ORAL_TABLET | Freq: Every day | ORAL | 1 refills | Status: AC

## 2024-08-25 NOTE — Progress Notes (Signed)
 "  Subjective:    Patient ID: Ashley Mcintosh, female    DOB: 10-17-1965, 59 y.o.   MRN: [id number]  HPI Discussed the use of AI scribe software for clinical note transcription with the patient, who gave verbal consent to proceed.  History of Present Illness Ashley Mcintosh is a 59 year old female with prediabetes and hyperlipidemia who presents for a recheck and medication review.  She has prediabetes with a recent A1c of 5.7. She is not currently on medication for diabetes but is closely monitoring her condition through lifestyle modifications, including weight management, diet, stress reduction, and physical activity. Family history includes risk factors for diabetes.  She has hyperlipidemia with an LDL cholesterol level of 151. She has been actively managing her weight, having reduced from 231 pounds to 207 pounds, and continues to focus on maintaining a healthy lifestyle.  She experiences seasonal affective disorder, which affects her energy levels during the winter months. She is currently on Wellbutrin  for seasonal affective disorder, which affects her energy levels during the winter months. She also takes escitalopram , which helps with her mood and focus at work.  She has experienced hair loss, which she attributes to weight loss shots, specifically tirzepatide through another provider. She is exploring options to manage this.  No breathing issues, chest pain, shortness of breath, coughing, or wheezing.     08/25/2024    8:34 AM  Depression screen PHQ 2/9  Decreased Interest 0  Down, Depressed, Hopeless 0  PHQ - 2 Score 0  Altered sleeping 1  Tired, decreased energy 1  Change in appetite 0  Feeling bad or failure about yourself  0  Trouble concentrating 1  Moving slowly or fidgety/restless 0  Suicidal thoughts 0  PHQ-9 Score 3  Difficult doing work/chores Somewhat difficult      08/25/2024    8:35 AM 03/31/2024    1:31 PM 03/09/2024    2:22 PM 12/02/2023   12:59 PM  GAD  7 : Generalized Anxiety Score  Nervous, Anxious, on Edge 1 0 0 0  Control/stop worrying 1 0 0 0  Worry too much - different things 1 0 0 0  Trouble relaxing 1 1 1  0  Restless 0 1 0 0  Easily annoyed or irritable 0 1 1 0  Afraid - awful might happen 0 0 0 0  Total GAD 7 Score 4 3 2  0  Anxiety Difficulty Somewhat difficult Not difficult at all Not difficult at all     Social History[1]      Objective:   Physical Exam NAD.  Alert, oriented.  Calm cheerful affect.  Making good eye contact.  Speech clear.  Normal behavior.  Thoughts logical coherent and relevant.  Lungs clear.  Heart regular rate rhythm. Today's Vitals   08/25/24 0832  BP: 112/76  Pulse: 78  Temp: 97.8 F (36.6 C)  SpO2: 98%  Weight: 207 lb 8 oz (94.1 kg)  Height: 5' 4 (1.626 m)   Body mass index is 35.62 kg/m.        Assessment & Plan:  1. Adjustment disorder with mixed anxiety and depressed mood (Primary) Adjustment disorder with mixed anxiety and depressed mood Symptoms may be exacerbated by seasonal affective disorder. Current regimen of bupropion  and escitalopram  is effective. - Increased bupropion  to 300 mg daily for two months. - Continue escitalopram  as prescribed. - Encouraged sunlight exposure and light therapy. - Recommended B12 vitamins with selenium. - buPROPion  (WELLBUTRIN  XL) 300 MG 24 hr tablet;  Take 1 tablet (300 mg total) by mouth daily.  Dispense: 90 tablet; Refill: 1  2. Seasonal affective disorder Adjustment disorder with mixed anxiety and depressed mood Symptoms may be exacerbated by seasonal affective disorder. Current regimen of bupropion  and escitalopram  is effective. - Increased bupropion  to 300 mg daily for two months. - Continue escitalopram  as prescribed. - Encouraged sunlight exposure and light therapy. - Recommended B12 vitamins with selenium. - buPROPion  (WELLBUTRIN  XL) 300 MG 24 hr tablet; Take 1 tablet (300 mg total) by mouth daily.  Dispense: 90 tablet; Refill:  1  Return in about 6 months (around 02/22/2025) for physical.        [1]  Social History Tobacco Use   Smoking status: Never   Smokeless tobacco: Never  Vaping Use   Vaping status: Never Used  Substance Use Topics   Alcohol use: Yes    Alcohol/week: 0.0 standard drinks of alcohol    Comment: weekly   Drug use: No   "

## 2024-09-11 ENCOUNTER — Other Ambulatory Visit: Payer: Self-pay | Admitting: Family Medicine

## 2024-09-11 DIAGNOSIS — F4323 Adjustment disorder with mixed anxiety and depressed mood: Secondary | ICD-10-CM

## 2024-12-04 ENCOUNTER — Encounter: Admitting: Nurse Practitioner
